# Patient Record
Sex: Female | Born: 1970 | Race: White | Hispanic: No | Smoking: Former smoker
Health system: Southern US, Community
[De-identification: ages and names within clinical notes are randomized; demographics above are authoritative.]

## PROBLEM LIST (undated history)

## (undated) DIAGNOSIS — R51 Headache: Secondary | ICD-10-CM

## (undated) DIAGNOSIS — M2540 Effusion, unspecified joint: Secondary | ICD-10-CM

## (undated) DIAGNOSIS — M8580 Other specified disorders of bone density and structure, unspecified site: Secondary | ICD-10-CM

## (undated) DIAGNOSIS — E119 Type 2 diabetes mellitus without complications: Secondary | ICD-10-CM

## (undated) DIAGNOSIS — E7800 Pure hypercholesterolemia, unspecified: Secondary | ICD-10-CM

## (undated) DIAGNOSIS — M2550 Pain in unspecified joint: Secondary | ICD-10-CM

## (undated) DIAGNOSIS — Z8709 Personal history of other diseases of the respiratory system: Secondary | ICD-10-CM

## (undated) DIAGNOSIS — M1990 Unspecified osteoarthritis, unspecified site: Secondary | ICD-10-CM

## (undated) DIAGNOSIS — T7840XA Allergy, unspecified, initial encounter: Secondary | ICD-10-CM

## (undated) HISTORY — DX: Pure hypercholesterolemia, unspecified: E78.00

## (undated) HISTORY — DX: Allergy, unspecified, initial encounter: T78.40XA

## (undated) HISTORY — PX: ABLATION: SHX5711

## (undated) HISTORY — PX: NO PAST SURGERIES: SHX2092

## (undated) HISTORY — DX: Type 2 diabetes mellitus without complications: E11.9

## (undated) HISTORY — DX: Other specified disorders of bone density and structure, unspecified site: M85.80

---

## 1998-07-29 ENCOUNTER — Other Ambulatory Visit: Admission: RE | Admit: 1998-07-29 | Discharge: 1998-07-29 | Payer: Self-pay | Admitting: Obstetrics and Gynecology

## 1998-09-03 ENCOUNTER — Inpatient Hospital Stay: Admission: AD | Admit: 1998-09-03 | Discharge: 1998-09-03 | Payer: Self-pay | Admitting: Obstetrics and Gynecology

## 1998-11-25 ENCOUNTER — Ambulatory Visit (HOSPITAL_COMMUNITY): Admission: RE | Admit: 1998-11-25 | Discharge: 1998-11-25 | Payer: Self-pay | Admitting: Obstetrics and Gynecology

## 1999-02-17 ENCOUNTER — Inpatient Hospital Stay (HOSPITAL_COMMUNITY): Admission: AD | Admit: 1999-02-17 | Discharge: 1999-02-20 | Payer: Self-pay | Admitting: Obstetrics and Gynecology

## 1999-02-25 ENCOUNTER — Encounter (HOSPITAL_COMMUNITY): Admission: RE | Admit: 1999-02-25 | Discharge: 1999-05-26 | Payer: Self-pay | Admitting: Obstetrics and Gynecology

## 1999-03-27 ENCOUNTER — Other Ambulatory Visit: Admission: RE | Admit: 1999-03-27 | Discharge: 1999-03-27 | Payer: Self-pay | Admitting: Obstetrics and Gynecology

## 2000-04-11 ENCOUNTER — Other Ambulatory Visit: Admission: RE | Admit: 2000-04-11 | Discharge: 2000-04-11 | Payer: Self-pay | Admitting: Obstetrics and Gynecology

## 2001-05-26 ENCOUNTER — Other Ambulatory Visit: Admission: RE | Admit: 2001-05-26 | Discharge: 2001-05-26 | Payer: Self-pay | Admitting: Obstetrics and Gynecology

## 2002-09-17 ENCOUNTER — Other Ambulatory Visit: Admission: RE | Admit: 2002-09-17 | Discharge: 2002-09-17 | Payer: Self-pay | Admitting: Obstetrics and Gynecology

## 2003-10-03 ENCOUNTER — Other Ambulatory Visit: Admission: RE | Admit: 2003-10-03 | Discharge: 2003-10-03 | Payer: Self-pay | Admitting: Obstetrics and Gynecology

## 2004-12-02 ENCOUNTER — Other Ambulatory Visit: Admission: RE | Admit: 2004-12-02 | Discharge: 2004-12-02 | Payer: Self-pay | Admitting: Obstetrics and Gynecology

## 2006-01-18 ENCOUNTER — Other Ambulatory Visit: Admission: RE | Admit: 2006-01-18 | Discharge: 2006-01-18 | Payer: Self-pay | Admitting: Obstetrics and Gynecology

## 2007-10-11 ENCOUNTER — Encounter: Admission: RE | Admit: 2007-10-11 | Discharge: 2007-10-11 | Payer: Self-pay | Admitting: Obstetrics and Gynecology

## 2009-12-11 ENCOUNTER — Encounter: Admission: RE | Admit: 2009-12-11 | Discharge: 2009-12-11 | Payer: Self-pay | Admitting: Obstetrics and Gynecology

## 2010-10-18 ENCOUNTER — Encounter: Payer: Self-pay | Admitting: Obstetrics and Gynecology

## 2012-01-05 ENCOUNTER — Other Ambulatory Visit: Payer: Self-pay | Admitting: Obstetrics and Gynecology

## 2012-01-05 DIAGNOSIS — N6009 Solitary cyst of unspecified breast: Secondary | ICD-10-CM

## 2012-01-07 ENCOUNTER — Ambulatory Visit
Admission: RE | Admit: 2012-01-07 | Discharge: 2012-01-07 | Disposition: A | Discharge status: Home / Self Care | Payer: 59 | Source: Ambulatory Visit | Attending: Obstetrics and Gynecology | Admitting: Obstetrics and Gynecology

## 2012-01-07 DIAGNOSIS — N6009 Solitary cyst of unspecified breast: Secondary | ICD-10-CM

## 2012-12-25 ENCOUNTER — Other Ambulatory Visit (HOSPITAL_COMMUNITY): Payer: 59

## 2013-01-09 ENCOUNTER — Encounter (HOSPITAL_COMMUNITY): Payer: Self-pay | Admitting: Pharmacy Technician

## 2013-01-13 NOTE — Pre-Procedure Instructions (Signed)
Theresa Payne  01/13/2013   Your procedure is scheduled on:  01-24-2013  Wednesday  Report to [facility] at 8:15 AM.Take East Elevators to 3rd floor  Call this number if you have problems the morning of surgery: [phone]   Remember:   Do not eat food or drink liquids after midnight.   Take these medicines the morning of surgery with A SIP OF WATER: none   Do not wear jewelry, make-up or nail polish.  Do not wear lotions, powders, or perfumes.   Do not shave 48 hours prior to surgery.   Do not bring valuables to the hospital.  Contacts, dentures or bridgework may not be worn into surgery.       Patients discharged the day of surgery will not be allowed to drive home.   Name and phone number of your driver: __________________________   Special Instructions: Shower using CHG 2 nights before surgery and the night before surgery.  If you shower the day of surgery use CHG.  Use special wash - you have one bottle of CHG for all showers.  You should use approximately 1/3 of the bottle for each shower.   Please read over the following fact sheets that you were given: Pain Booklet, Coughing and Deep Breathing and Surgical Site Infection Prevention

## 2013-01-15 ENCOUNTER — Encounter (HOSPITAL_COMMUNITY)
Admission: RE | Admit: 2013-01-15 | Discharge: 2013-01-15 | Disposition: A | Discharge status: Home / Self Care | Payer: 59 | Source: Ambulatory Visit | Attending: Orthopedic Surgery | Admitting: Orthopedic Surgery

## 2013-01-15 ENCOUNTER — Encounter (HOSPITAL_COMMUNITY): Payer: Self-pay

## 2013-01-15 DIAGNOSIS — Z01812 Encounter for preprocedural laboratory examination: Secondary | ICD-10-CM | POA: Insufficient documentation

## 2013-01-15 HISTORY — DX: Pain in unspecified joint: M25.50

## 2013-01-15 HISTORY — DX: Unspecified osteoarthritis, unspecified site: M19.90

## 2013-01-15 HISTORY — DX: Headache: R51

## 2013-01-15 HISTORY — DX: Effusion, unspecified joint: M25.40

## 2013-01-15 HISTORY — DX: Personal history of other diseases of the respiratory system: Z87.09

## 2013-01-15 LAB — CBC
MCH: 31.7 pg (ref 26.0–34.0)
MCV: 89.4 fL (ref 78.0–100.0)
Platelets: 161 10*3/uL (ref 150–400)

## 2013-01-15 LAB — SURGICAL PCR SCREEN: MRSA, PCR: NEGATIVE

## 2013-01-15 NOTE — Progress Notes (Signed)
Spoke with Cordelia Pen at Dr.Murphy's office and requested orders

## 2013-01-15 NOTE — Progress Notes (Signed)
Pt doesn't have a cardiologist  Denies stress test/heart cath/echo  Pt doesn't have a medical md-states will go to a free clinic if she gets sick  Denies ekg or cxr in past yr

## 2013-01-21 NOTE — H&P (Signed)
  [facility] [address]   [address] [place], [place] [address] [phone] A Division of [facility]  Loreta Ave, M.D.   Robert A. Thurston Hole, M.D.   Burnell Blanks, M.D.   Eulas Post, M.D.   Lunette Stands, M.D Buford Dresser, M.D.  Charlsie Quest, M.D.   Estell Harpin, M.D.   Melina Fiddler, M.D. Genene Churn. Barry Dienes, PA-C            Kirstin A. Shepperson, PA-C Josh San Isidro, PA-C [place], [place]   RE: Theresa Payne, Theresa Payne                                [id number]      DOB: 1971/03/22 PROGRESS NOTE: 01-09-13 Chief complaint: Left hip pain.  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  Returns.  States that symptoms are unchanged from previous visit.  She is wanting to proceed with left hip arthroscopy with debridement as scheduled.  She has had temporary improvement with previous intraarticular Depo-Medrol/Marcaine injections.   Current medications: Calcium and multivitamin. Allergies: Darvocet, Codeine and shellfish. Past medical/surgical history: Healthy. Review of systems: Patient currently denies lightheadedness, dizziness, fevers or chills, cardiac, pulmonary, GI, GU or neuro issues. Family history: Positive for heart disease, hypertension and diabetes. Social history: Quit smoking in 1997, admits occasional alcohol use.  Patient is divorced and has a teenage son.      EXAMINATION: Height: 5?5.  Weight: 125 pounds.  Temperature: 98.3.  Respirations: 16.  Blood pressure: 110/68.  Pulse: 63.  Pleasant white female, alert and oriented x 3 and in no acute distress.  No increase in respiratory effort.  Head is normocephalic, a traumatic.  PERRLA, EOMI.  Lungs: CTA bilaterally.  No wheezes.  Heart: RRR. No murmurs noted.  Abdomen: Flat and non-distended.  NBS x 4.  Soft and non-tender.  Left hip: Good range of motion,  but does have discomfort.  Gait is somewhat antalgic.  Neurovascularly intact.  Skin warm and dry.    X-RAYS: Previous MRI scan of the left hip shows mild degenerative fraying changes involving the superior labrum, along with a partial labral separation/detachment.    IMPRESSION: Left hip pain and mechanical symptoms secondary to labral tear.   PLAN: We will proceed with left hip arthroscopy with debridement as scheduled.  Surgical procedure, along with potential rehab/recovery time discussed.  All questions answered.    Loreta Ave, M.D.   Electronically verified by Loreta Ave, M.D. DFM(JMO):jjh D 01-10-13 T 01-11-13

## 2013-01-23 MED ORDER — CEFAZOLIN SODIUM-DEXTROSE 2-3 GM-% IV SOLR
2.0000 g | INTRAVENOUS | Status: AC
  Administered 2013-01-24: 2 g via INTRAVENOUS

## 2013-01-24 ENCOUNTER — Encounter (HOSPITAL_COMMUNITY)
Admission: RE | Disposition: A | Discharge status: Home / Self Care | Payer: Self-pay | Source: Ambulatory Visit | Attending: Orthopedic Surgery

## 2013-01-24 ENCOUNTER — Ambulatory Visit (HOSPITAL_COMMUNITY): Payer: 59 | Admitting: Anesthesiology

## 2013-01-24 ENCOUNTER — Encounter (HOSPITAL_COMMUNITY): Payer: Self-pay | Admitting: Anesthesiology

## 2013-01-24 ENCOUNTER — Ambulatory Visit (HOSPITAL_COMMUNITY)
Admission: RE | Admit: 2013-01-24 | Discharge: 2013-01-24 | Disposition: A | Discharge status: Home / Self Care | Payer: 59 | Source: Ambulatory Visit | Attending: Orthopedic Surgery | Admitting: Orthopedic Surgery

## 2013-01-24 ENCOUNTER — Ambulatory Visit (HOSPITAL_COMMUNITY): Payer: 59

## 2013-01-24 DIAGNOSIS — M169 Osteoarthritis of hip, unspecified: Secondary | ICD-10-CM | POA: Insufficient documentation

## 2013-01-24 DIAGNOSIS — M1610 Unilateral primary osteoarthritis, unspecified hip: Secondary | ICD-10-CM | POA: Insufficient documentation

## 2013-01-24 DIAGNOSIS — Z9889 Other specified postprocedural states: Secondary | ICD-10-CM

## 2013-01-24 DIAGNOSIS — M9420 Chondromalacia, unspecified site: Secondary | ICD-10-CM | POA: Insufficient documentation

## 2013-01-24 DIAGNOSIS — M2419 Other articular cartilage disorders, other specified site: Secondary | ICD-10-CM | POA: Insufficient documentation

## 2013-01-24 DIAGNOSIS — M249 Joint derangement, unspecified: Secondary | ICD-10-CM | POA: Insufficient documentation

## 2013-01-24 HISTORY — PX: HIP ARTHROSCOPY: SHX668

## 2013-01-24 LAB — URINALYSIS, ROUTINE W REFLEX MICROSCOPIC
Bilirubin Urine: NEGATIVE
Hgb urine dipstick: NEGATIVE
Ketones, ur: NEGATIVE mg/dL
Nitrite: NEGATIVE
pH: 7 (ref 5.0–8.0)

## 2013-01-24 LAB — COMPREHENSIVE METABOLIC PANEL
AST: 20 U/L (ref 0–37)
Alkaline Phosphatase: 55 U/L (ref 39–117)
BUN: 15 mg/dL (ref 6–23)
CO2: 28 mEq/L (ref 19–32)
Chloride: 106 mEq/L (ref 96–112)
Creatinine, Ser: 0.8 mg/dL (ref 0.50–1.10)
GFR calc non Af Amer: 89 mL/min — ABNORMAL LOW (ref >90)
Total Bilirubin: 0.6 mg/dL (ref 0.3–1.2)

## 2013-01-24 LAB — APTT: aPTT: 30 seconds (ref 24–37)

## 2013-01-24 LAB — PROTIME-INR: INR: 0.94 (ref 0.00–1.49)

## 2013-01-24 SURGERY — ARTHROSCOPY HIP
Anesthesia: General | Site: Hip | Laterality: Left | Wound class: Clean

## 2013-01-24 MED ORDER — EPHEDRINE SULFATE 50 MG/ML IJ SOLN
INTRAMUSCULAR | Status: DC | PRN
  Administered 2013-01-24: 5 mg via INTRAVENOUS
  Administered 2013-01-24: 10 mg via INTRAVENOUS

## 2013-01-24 MED ORDER — ROCURONIUM BROMIDE 100 MG/10ML IV SOLN
INTRAVENOUS | Status: DC | PRN
  Administered 2013-01-24: 10 mg via INTRAVENOUS
  Administered 2013-01-24: 40 mg via INTRAVENOUS

## 2013-01-24 MED ORDER — PROPOFOL 10 MG/ML IV BOLUS
INTRAVENOUS | Status: DC | PRN
  Administered 2013-01-24: 200 mg via INTRAVENOUS

## 2013-01-24 MED ORDER — LACTATED RINGERS IV SOLN
INTRAVENOUS | Status: DC
  Administered 2013-01-24 (×2): via INTRAVENOUS

## 2013-01-24 MED ORDER — HYDROMORPHONE HCL PF 1 MG/ML IJ SOLN: 0.2500 mg | INTRAMUSCULAR | Status: DC | PRN

## 2013-01-24 MED ORDER — LIDOCAINE HCL (CARDIAC) 20 MG/ML IV SOLN
INTRAVENOUS | Status: DC | PRN
  Administered 2013-01-24: 100 mg via INTRAVENOUS

## 2013-01-24 MED ORDER — SODIUM CHLORIDE 0.9 % IR SOLN
Status: DC | PRN
  Administered 2013-01-24: 6000 mL

## 2013-01-24 MED ORDER — METOCLOPRAMIDE HCL 5 MG/ML IJ SOLN
INTRAMUSCULAR | Status: DC | PRN
  Administered 2013-01-24: 10 mg via INTRAVENOUS

## 2013-01-24 MED ORDER — CEFAZOLIN SODIUM-DEXTROSE 2-3 GM-% IV SOLR
INTRAVENOUS | Status: AC
  Filled 2013-01-24: qty 50

## 2013-01-24 MED ORDER — METOCLOPRAMIDE HCL 5 MG/ML IJ SOLN: 10.0000 mg | Freq: Once | INTRAMUSCULAR | Status: DC | PRN

## 2013-01-24 MED ORDER — OXYCODONE HCL 5 MG/5ML PO SOLN: 5.0000 mg | Freq: Once | ORAL | Status: AC | PRN

## 2013-01-24 MED ORDER — BUPIVACAINE HCL (PF) 0.25 % IJ SOLN
INTRAMUSCULAR | Status: AC
  Filled 2013-01-24: qty 30

## 2013-01-24 MED ORDER — FENTANYL CITRATE 0.05 MG/ML IJ SOLN
INTRAMUSCULAR | Status: DC | PRN
  Administered 2013-01-24: 100 ug via INTRAVENOUS

## 2013-01-24 MED ORDER — ONDANSETRON HCL 4 MG/2ML IJ SOLN
INTRAMUSCULAR | Status: DC | PRN
  Administered 2013-01-24: 4 mg via INTRAVENOUS

## 2013-01-24 MED ORDER — DEXAMETHASONE SODIUM PHOSPHATE 10 MG/ML IJ SOLN
INTRAMUSCULAR | Status: DC | PRN
  Administered 2013-01-24: 8 mg via INTRAVENOUS

## 2013-01-24 MED ORDER — OXYCODONE-ACETAMINOPHEN 5-325 MG PO TABS: 1.0000 | ORAL_TABLET | ORAL | Status: DC | PRN

## 2013-01-24 MED ORDER — NEOSTIGMINE METHYLSULFATE 1 MG/ML IJ SOLN
INTRAMUSCULAR | Status: DC | PRN
  Administered 2013-01-24: 4 mg via INTRAVENOUS

## 2013-01-24 MED ORDER — OXYCODONE HCL 5 MG PO TABS
ORAL_TABLET | ORAL | Status: AC
  Administered 2013-01-24: 5 mg via ORAL
  Filled 2013-01-24: qty 1

## 2013-01-24 MED ORDER — METHYLPREDNISOLONE ACETATE 80 MG/ML IJ SUSP
INTRAMUSCULAR | Status: AC
  Filled 2013-01-24: qty 1

## 2013-01-24 MED ORDER — MIDAZOLAM HCL 5 MG/5ML IJ SOLN
INTRAMUSCULAR | Status: DC | PRN
  Administered 2013-01-24: 2 mg via INTRAVENOUS

## 2013-01-24 MED ORDER — GLYCOPYRROLATE 0.2 MG/ML IJ SOLN
INTRAMUSCULAR | Status: DC | PRN
  Administered 2013-01-24: 0.6 mg via INTRAVENOUS

## 2013-01-24 MED ORDER — BUPIVACAINE HCL (PF) 0.25 % IJ SOLN
INTRAMUSCULAR | Status: DC | PRN
  Administered 2013-01-24: 20 mL
  Administered 2013-01-24: 10 mL via INTRA_ARTICULAR

## 2013-01-24 MED ORDER — OXYCODONE HCL 5 MG PO TABS: 5.0000 mg | ORAL_TABLET | Freq: Once | ORAL | Status: AC | PRN

## 2013-01-24 MED ORDER — METHYLPREDNISOLONE ACETATE 80 MG/ML IJ SUSP
INTRAMUSCULAR | Status: DC | PRN
  Administered 2013-01-24: 80 mg via INTRA_ARTICULAR

## 2013-01-24 SURGICAL SUPPLY — 36 items
BLADE DA 4.2 (BLADE) IMPLANT
BLADE GREAT WHITE 4.2 (BLADE) IMPLANT
BLADE SURG 11 STRL SS (BLADE) ×2 IMPLANT
BOOTCOVER CLEANROOM LRG (PROTECTIVE WEAR) ×4 IMPLANT
CLOTH BEACON ORANGE TIMEOUT ST (SAFETY) ×2 IMPLANT
DRAPE STERI IOBAN 125X83 (DRAPES) ×2 IMPLANT
DRSG EMULSION OIL 3X3 NADH (GAUZE/BANDAGES/DRESSINGS) ×2 IMPLANT
DRSG MEPILEX BORDER 4X4 (GAUZE/BANDAGES/DRESSINGS) ×2 IMPLANT
DRSG PAD ABDOMINAL 8X10 ST (GAUZE/BANDAGES/DRESSINGS) ×2 IMPLANT
DURAPREP 26ML APPLICATOR (WOUND CARE) ×2 IMPLANT
ELECT MENISCUS 165MM 90D (ELECTRODE) ×2 IMPLANT
GAUZE XEROFORM 1X8 LF (GAUZE/BANDAGES/DRESSINGS) ×2 IMPLANT
GLOVE BIOGEL PI IND STRL 8 (GLOVE) ×1 IMPLANT
GLOVE BIOGEL PI INDICATOR 8 (GLOVE) ×1
GLOVE ORTHO TXT STRL SZ7.5 (GLOVE) ×4 IMPLANT
GOWN STRL NON-REIN LRG LVL3 (GOWN DISPOSABLE) ×6 IMPLANT
KIT HIP ARTHROSCOPY (SET/KITS/TRAYS/PACK) ×2 IMPLANT
KIT ROOM TURNOVER OR (KITS) ×2 IMPLANT
MANIFOLD NEPTUNE II (INSTRUMENTS) ×2 IMPLANT
MARKER SKIN DUAL TIP RULER LAB (MISCELLANEOUS) ×2 IMPLANT
NEEDLE HYPO 18GX1.5 BLUNT FILL (NEEDLE) ×2 IMPLANT
PACK SURGICAL SETUP 50X90 (CUSTOM PROCEDURE TRAY) ×2 IMPLANT
PAD ARMBOARD 7.5X6 YLW CONV (MISCELLANEOUS) ×4 IMPLANT
SET ARTHROSCOPY TUBING (MISCELLANEOUS) ×1
SET ARTHROSCOPY TUBING LN (MISCELLANEOUS) ×1 IMPLANT
SHAVER EXTENDED LENGTH 4.2 (BLADE) ×2 IMPLANT
SPONGE GAUZE 4X4 12PLY (GAUZE/BANDAGES/DRESSINGS) ×2 IMPLANT
SPONGE LAP 18X18 X RAY DECT (DISPOSABLE) ×2 IMPLANT
SUT ETHILON 4 0 PS 2 18 (SUTURE) ×2 IMPLANT
TAPE CLOTH 4X10 WHT NS (GAUZE/BANDAGES/DRESSINGS) ×2 IMPLANT
TOWEL OR 17X24 6PK STRL BLUE (TOWEL DISPOSABLE) ×2 IMPLANT
TOWEL OR 17X26 10 PK STRL BLUE (TOWEL DISPOSABLE) ×2 IMPLANT
TUBE CONNECTING 12X1/4 (SUCTIONS) ×2 IMPLANT
WAND 50 DEG COVAC W/CORD (SURGICAL WAND) IMPLANT
WAND 90 DEG TURBOVAC W/CORD (SURGICAL WAND) IMPLANT
WATER STERILE IRR 1000ML POUR (IV SOLUTION) ×2 IMPLANT

## 2013-01-24 NOTE — Preoperative (Signed)
Beta Blockers   Reason not to administer Beta Blockers:Not Applicable 

## 2013-01-24 NOTE — Anesthesia Procedure Notes (Signed)
Procedure Name: Intubation Date/Time: 01/24/2013 11:58 AM Performed by: Arlice Colt B Pre-anesthesia Checklist: Patient identified, Emergency Drugs available, Suction available, Patient being monitored and Timeout performed Patient Re-evaluated:Patient Re-evaluated prior to inductionOxygen Delivery Method: Circle system utilized Preoxygenation: Pre-oxygenation with 100% oxygen Intubation Type: IV induction Ventilation: Mask ventilation without difficulty Laryngoscope Size: Mac and 3 Grade View: Grade II Tube type: Oral Tube size: 7.5 mm Number of attempts: 1 Airway Equipment and Method: Stylet Placement Confirmation: ETT inserted through vocal cords under direct vision,  positive ETCO2 and breath sounds checked- equal and bilateral Secured at: 21 cm Tube secured with: Tape Dental Injury: Teeth and Oropharynx as per pre-operative assessment

## 2013-01-24 NOTE — Interval H&P Note (Signed)
History and Physical Interval Note:  01/24/2013 8:28 AM  Theresa Payne  has presented today for surgery, with the diagnosis of left hip labral tear  The various methods of treatment have been discussed with the patient and family. After consideration of risks, benefits and other options for treatment, the patient has consented to  Procedure(s): ARTHROSCOPY HIP (Left) as a surgical intervention .  The patient's history has been reviewed, patient examined, no change in status, stable for surgery.  I have reviewed the patient's chart and labs.  Questions were answered to the patient's satisfaction.     [name]

## 2013-01-24 NOTE — Brief Op Note (Signed)
01/24/2013  1:15 PM  PATIENT:  Theresa Payne  42 y.o. female  PRE-OPERATIVE DIAGNOSIS:  left hip labral tear  POST-OPERATIVE DIAGNOSIS:  left hip labral tear  PROCEDURE:  Procedure(s): ARTHROSCOPY HIP with labral tear debridment and chondroplasty (Left)  SURGEON:  Surgeon(s) and Role:    * Loreta Ave, MD - Primary  PHYSICIAN ASSISTANT: Keatin Benham M    ANESTHESIA:   general  EBL:  Total I/O In: 1000 [I.V.:1000] Out: -    SPECIMEN:  No Specimen    COUNTS:  YES  TOURNIQUET:  * No tourniquets in log *   PATIENT DISPOSITION:  PACU - hemodynamically stable.

## 2013-01-24 NOTE — Anesthesia Preprocedure Evaluation (Signed)
Anesthesia Evaluation  Patient identified by MRN, date of birth, ID band Patient awake    Reviewed: Allergy & Precautions, H&P , NPO status , Patient's Chart, lab work & pertinent test results, reviewed documented beta blocker date and time   Airway Mallampati: II TM Distance: >3 FB Neck ROM: full    Dental   Pulmonary neg pulmonary ROS,  breath sounds clear to auscultation        Cardiovascular negative cardio ROS  Rhythm:regular     Neuro/Psych  Headaches, negative psych ROS   GI/Hepatic negative GI ROS, Neg liver ROS,   Endo/Other  negative endocrine ROS  Renal/GU negative Renal ROS  negative genitourinary   Musculoskeletal   Abdominal   Peds  Hematology negative hematology ROS (+)   Anesthesia Other Findings See surgeon's H&P   Reproductive/Obstetrics negative OB ROS                           Anesthesia Physical Anesthesia Plan  ASA: II  Anesthesia Plan: General   Post-op Pain Management:    Induction: Intravenous  Airway Management Planned: Oral ETT  Additional Equipment:   Intra-op Plan:   Post-operative Plan: Extubation in OR  Informed Consent: I have reviewed the patients History and Physical, chart, labs and discussed the procedure including the risks, benefits and alternatives for the proposed anesthesia with the patient or authorized representative who has indicated his/her understanding and acceptance.   Dental Advisory Given  Plan Discussed with: CRNA and Surgeon  Anesthesia Plan Comments:         Anesthesia Quick Evaluation

## 2013-01-24 NOTE — Transfer of Care (Signed)
Immediate Anesthesia Transfer of Care Note  Patient: Theresa Payne  Procedure(s) Performed: Procedure(s): ARTHROSCOPY HIP with labral tear debridment and chondroplasty (Left)  Patient Location: PACU  Anesthesia Type:General  Level of Consciousness: awake, alert  and oriented  Airway & Oxygen Therapy: Patient Spontanous Breathing and Patient connected to nasal cannula oxygen  Post-op Assessment: Report given to PACU RN, Post -op Vital signs reviewed and stable and Patient moving all extremities X 4  Post vital signs: Reviewed and stable  Complications: No apparent anesthesia complications

## 2013-01-24 NOTE — Progress Notes (Signed)
Orthopedic Tech Progress Note Patient Details:  Theresa Payne 09-06-1971 [id number] Issued crutches, taught proper use. Ortho Devices Type of Ortho Device: Crutches Ortho Device/Splint Interventions: Application;Adjustment   Lesle Chris 01/24/2013, 3:02 PM

## 2013-01-25 NOTE — Anesthesia Postprocedure Evaluation (Signed)
Anesthesia Post Note  Patient: Theresa Payne  Procedure(s) Performed: Procedure(s) (LRB): ARTHROSCOPY HIP with labral tear debridment and chondroplasty (Left)  Anesthesia type: General  Patient location: PACU  Post pain: Pain level controlled  Post assessment: Patient's Cardiovascular Status Stable  Last Vitals:  Filed Vitals:   01/24/13 1440  BP: 97/62  Pulse:   Temp: 35.8 C  Resp: 14    Post vital signs: Reviewed and stable  Level of consciousness: alert  Complications: No apparent anesthesia complications

## 2013-01-25 NOTE — Op Note (Signed)
NAME:  Theresa Payne, Theresa Payne              ACCOUNT NO.:  [id number]  MEDICAL RECORD NO.:  [id number]  LOCATION:  [facility]                         FACILITY:  [facility]  PHYSICIAN:  Loreta Ave, M.D. DATE OF BIRTH:  March 21, 1971  DATE OF PROCEDURE:  01/24/2013 DATE OF DISCHARGE:  01/24/2013                              OPERATIVE REPORT   PREOPERATIVE DIAGNOSIS:  Left hip anterior superior labral tear.  POSTOPERATIVE DIAGNOSIS:  Left hip anterior superior labral tear with some focal chondromalacia below that.  PROCEDURE:  Left hip exam under anesthesia, arthroscopy, with fluoroscopic guidance.  Debridement of labrum and articular cartilage.  SURGEON:  Loreta Ave, MD  ASSISTANT:  Genene Churn. Barry Dienes, [place], present throughout the entire case and necessary for timely completion of procedure.  ANESTHESIA:  General.  ESTIMATED BLOOD LOSS:  Minimal.  SPECIMENS:  None.  CULTURES:  None.  COMPLICATIONS:  None.  DRESSINGS:  Soft compressive.  DESCRIPTION OF PROCEDURE:  The patient was brought to the operating room, placed on the operating table in supine position.  After adequate anesthesia had been obtained, placed on the fracture table in lateral position with appropriate padding and support.  Prepped and draped in usual sterile fashion.  Longitudinal traction fluoroscopic guidance. The hip was opened a cm to allow access.  Two portals, one anterolateral, the other posterolateral.  Utilizing first spinal needle, then guidewire, then dilator, the hip was accessed.  The arthroscope was introduced along with a shaver.  Complex tearing at labrum all along the top, front more than back.  Debrided back to a stable surface.  A little focal grade 3 and 4 chondromalacia with a rim underneath the tear debrided.  Entire joint inspected.  No other significant findings were appreciated.  Remaining articular cartilage looked good.  The hip was injected with Depo-Medrol and Marcaine.  Traction removed.   Fluoroscopy used to confirm adequate reduction.  Portals were closed with nylon after we injected with Marcaine.  Sterile compressive dressing applied.  Returned to supine position.  Anesthesia reversed.  Brought to the recovery room. Tolerated the surgery well with no complications.     Loreta Ave, M.D.     DFM/MEDQ  D:  01/24/2013  T:  01/25/2013  Job:  [id number]

## 2013-01-29 ENCOUNTER — Encounter (HOSPITAL_COMMUNITY): Payer: Self-pay | Admitting: Orthopedic Surgery

## 2018-09-05 ENCOUNTER — Other Ambulatory Visit: Payer: Self-pay | Admitting: Obstetrics and Gynecology

## 2018-09-05 DIAGNOSIS — Z1231 Encounter for screening mammogram for malignant neoplasm of breast: Secondary | ICD-10-CM

## 2018-09-13 ENCOUNTER — Ambulatory Visit
Admission: RE | Admit: 2018-09-13 | Discharge: 2018-09-13 | Disposition: A | Discharge status: Home / Self Care | Payer: BLUE CROSS/BLUE SHIELD | Source: Ambulatory Visit | Attending: Obstetrics and Gynecology | Admitting: Obstetrics and Gynecology

## 2018-09-13 DIAGNOSIS — Z1231 Encounter for screening mammogram for malignant neoplasm of breast: Secondary | ICD-10-CM

## 2018-09-14 ENCOUNTER — Other Ambulatory Visit: Payer: Self-pay | Admitting: Obstetrics and Gynecology

## 2018-09-14 DIAGNOSIS — R928 Other abnormal and inconclusive findings on diagnostic imaging of breast: Secondary | ICD-10-CM

## 2018-09-18 ENCOUNTER — Ambulatory Visit
Admission: RE | Admit: 2018-09-18 | Discharge: 2018-09-18 | Disposition: A | Discharge status: Home / Self Care | Payer: BLUE CROSS/BLUE SHIELD | Source: Ambulatory Visit | Attending: Obstetrics and Gynecology | Admitting: Obstetrics and Gynecology

## 2018-09-18 ENCOUNTER — Ambulatory Visit: Payer: BLUE CROSS/BLUE SHIELD

## 2018-09-18 DIAGNOSIS — R928 Other abnormal and inconclusive findings on diagnostic imaging of breast: Secondary | ICD-10-CM

## 2020-03-25 IMAGING — MG DIGITAL SCREENING BILATERAL MAMMOGRAM WITH TOMO AND CAD
8 series · 9 of 24 positions shown · non-contrast
Comparison: 08/22/2017 and earlier

CLINICAL DATA: Screening.

EXAM:
DIGITAL SCREENING BILATERAL MAMMOGRAM WITH TOMO AND CAD

[L MLO synth-2D]
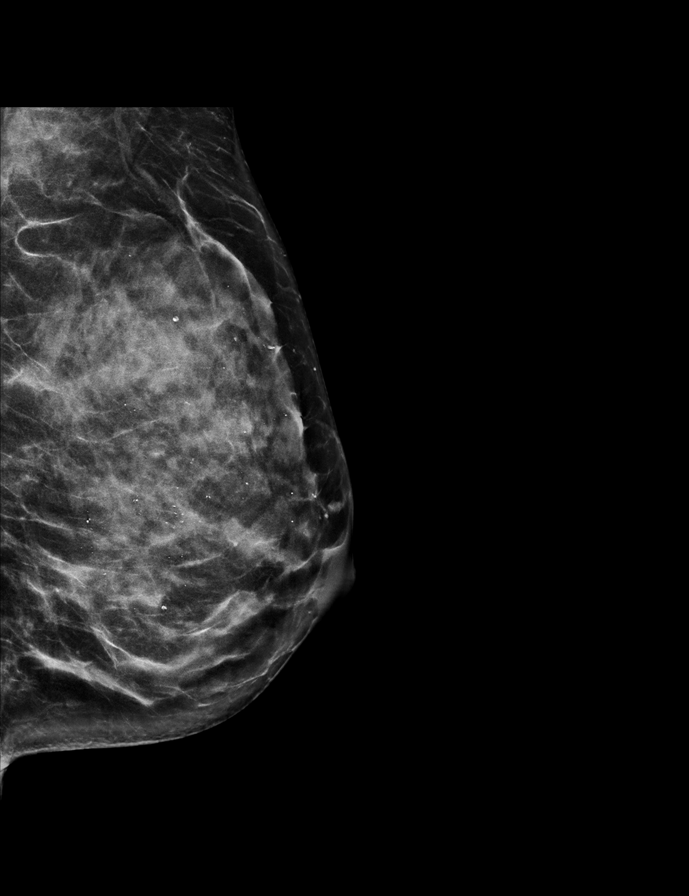

[R CC synth-2D]
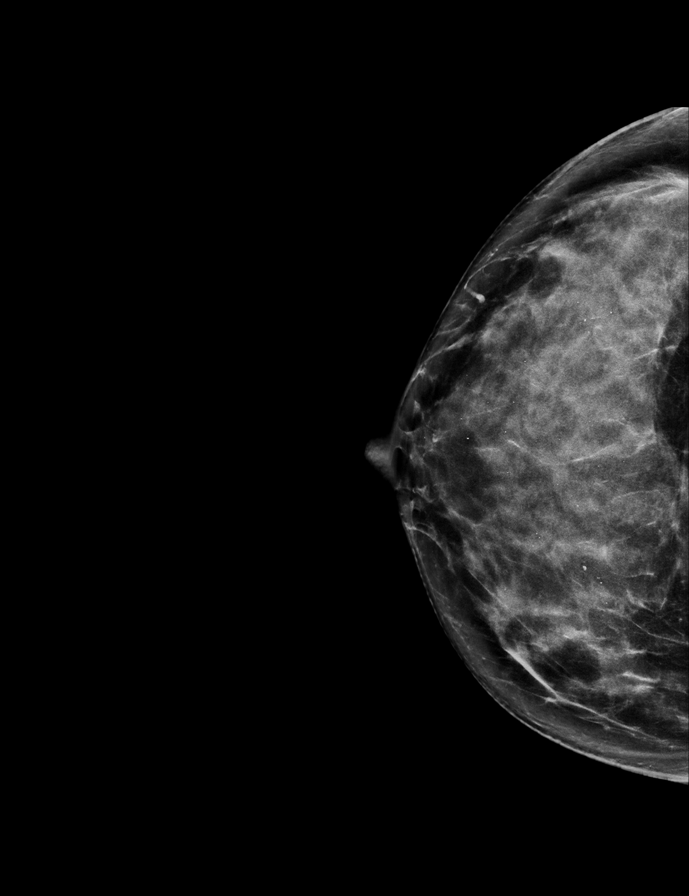

[L CC synth-2D]
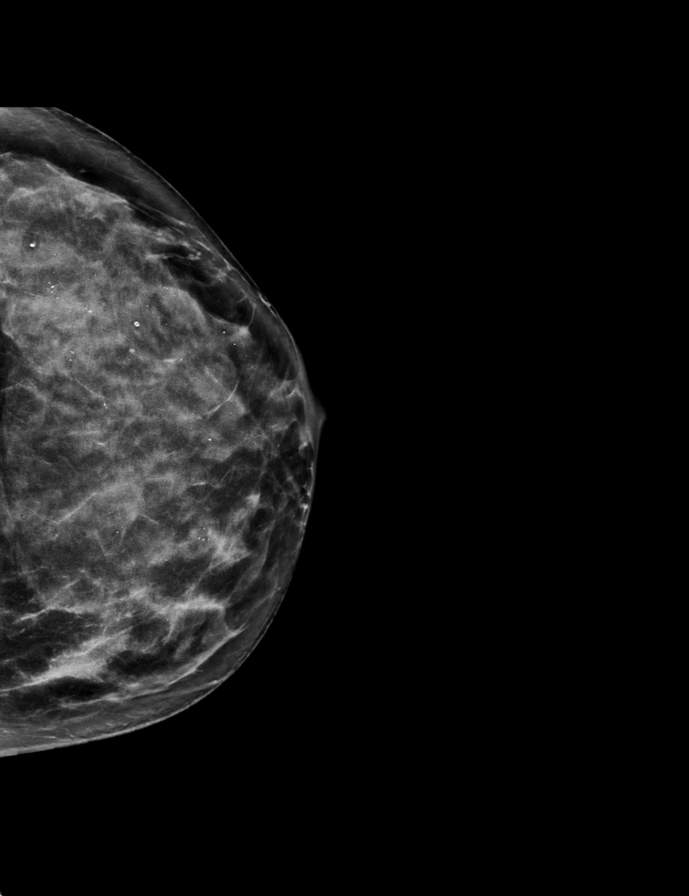

[R MLO synth-2D]
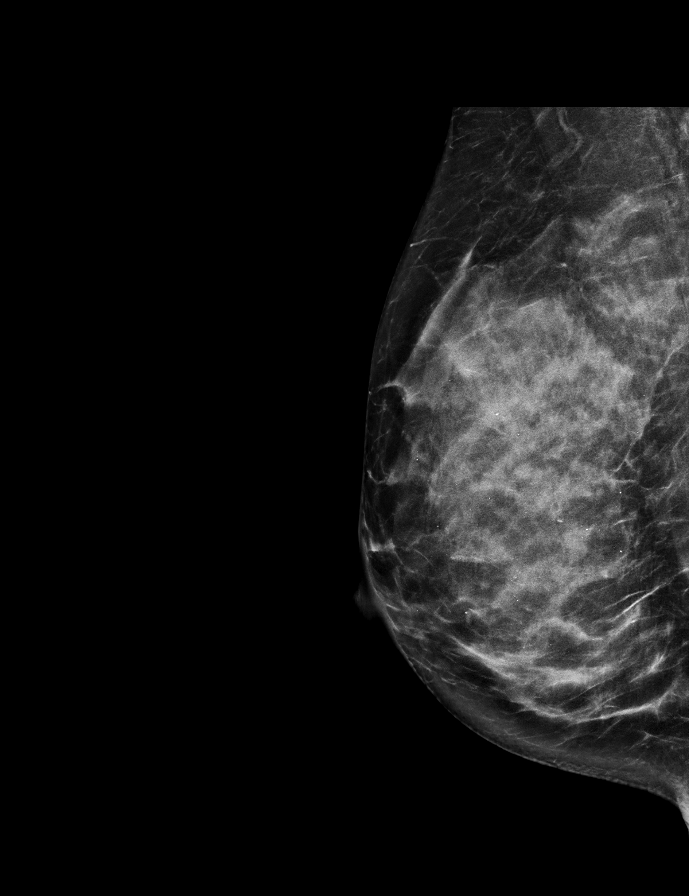

[R MLO tomo · 2 of 64 frames shown]
[frame 21/64]
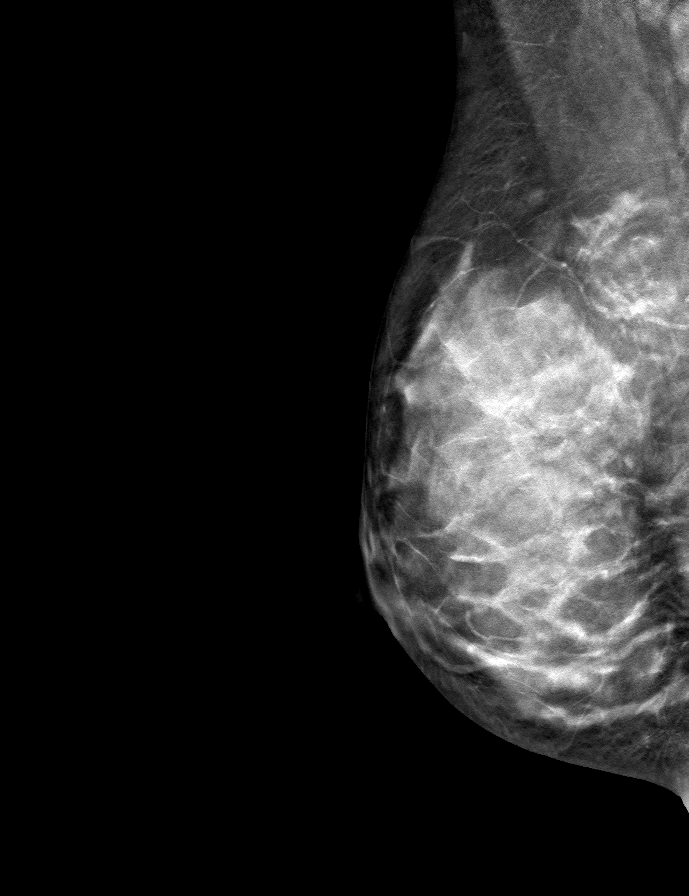
[frame 33/64]
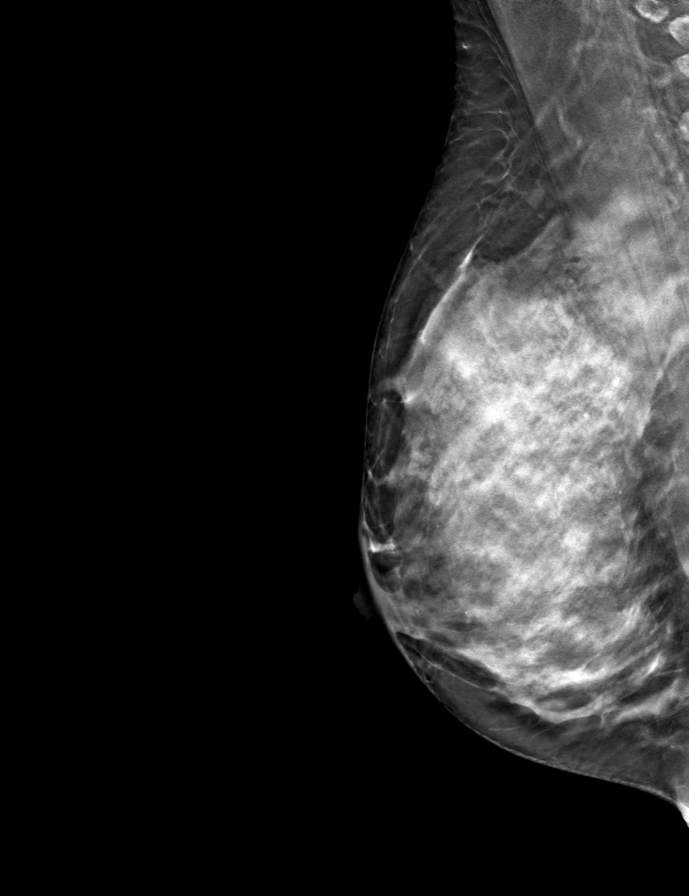

[L MLO tomo · tomo slice 31/62.0]
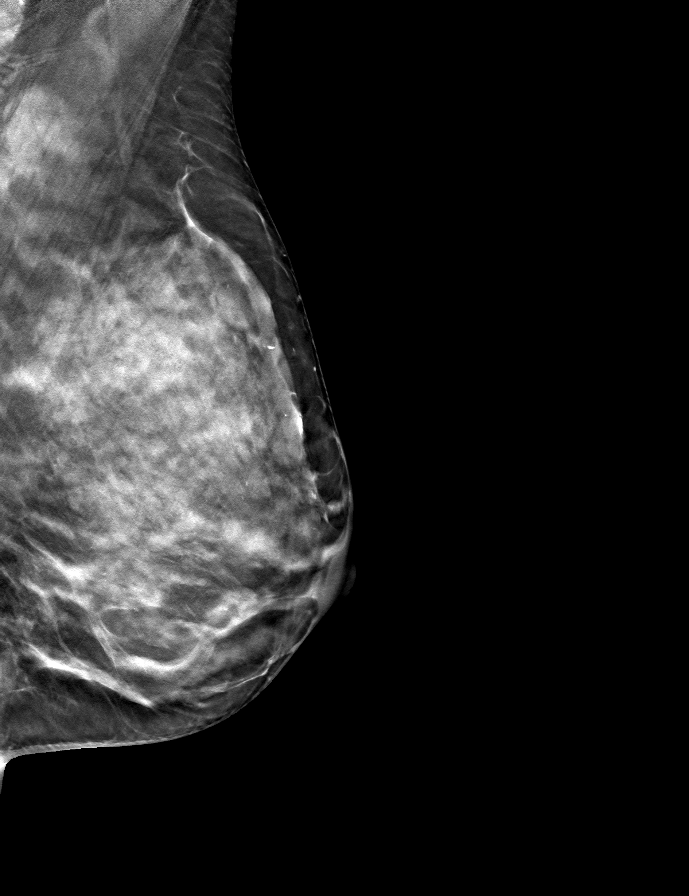

[L CC tomo · tomo slice 30/59.0]
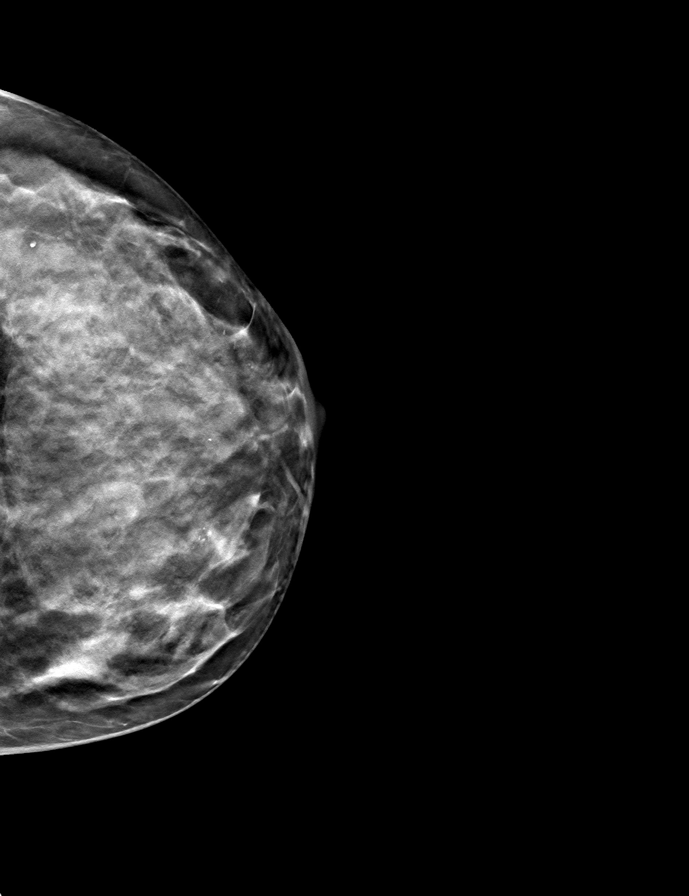

[R CC tomo · tomo slice 33/64.0]
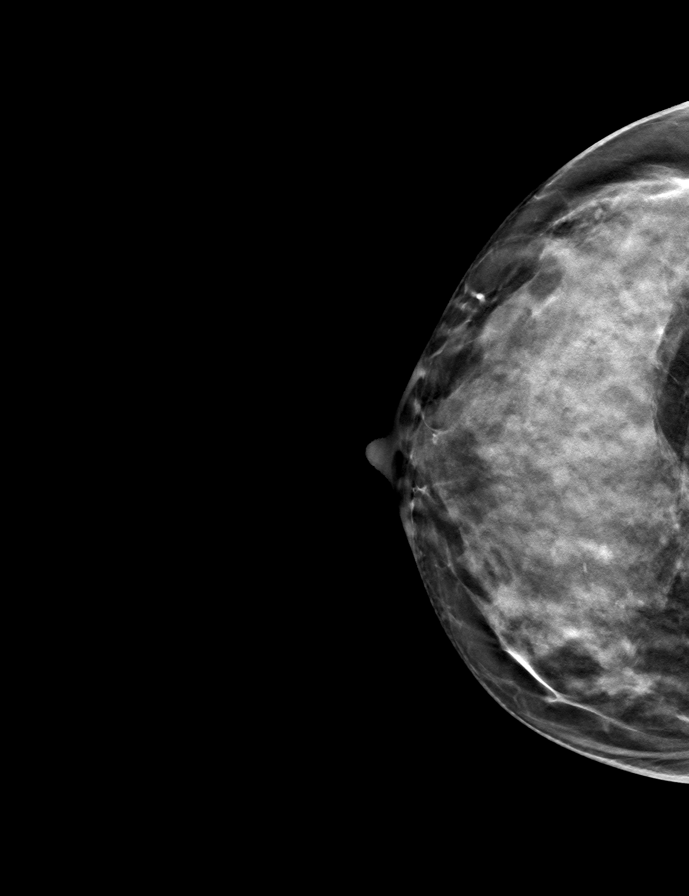

[9 of 24 positions shown; findings below may reference images not displayed]

ACR Breast Density Category d: The breast tissue is extremely dense,
which lowers the sensitivity of mammography.
FINDINGS: In the left breast, possible distortion warrants further evaluation.
In the right breast, no findings suspicious for malignancy. Images
were processed with CAD.
IMPRESSION: Further evaluation is suggested for possible distortion in the left
breast.

RECOMMENDATION:
Diagnostic mammogram and possibly ultrasound of the left breast.
(Code:[id number]-[id number])

The patient will be contacted regarding the findings, and additional
imaging will be scheduled.

BI-RADS CATEGORY  0: Incomplete. Need additional imaging evaluation
and/or prior mammograms for comparison.

## 2022-09-01 ENCOUNTER — Encounter: Payer: Self-pay | Admitting: Internal Medicine

## 2022-09-09 ENCOUNTER — Encounter: Payer: Self-pay | Admitting: Cardiology

## 2022-09-09 ENCOUNTER — Ambulatory Visit: Payer: 59 | Attending: Cardiology | Admitting: Cardiology

## 2022-09-09 VITALS — BP 120/80 | HR 64 | Ht 65.0 in | Wt 139.0 lb

## 2022-09-09 DIAGNOSIS — E7800 Pure hypercholesterolemia, unspecified: Secondary | ICD-10-CM

## 2022-09-09 NOTE — Progress Notes (Signed)
Cardiology Office Note:    Date:  09/09/2022   ID:  Theresa Payne, DOB April 14, 1971, MRN [id number]  PCP:  Aviva Kluver   [facility] Providers Cardiologist:  Donato Schultz, MD     Referring MD: Candice Camp, MD    History of Present Illness:    Theresa Payne is a 51 y.o. female here for the evaluation of hyperlipidemia at the request of Dr. Candice Camp from [facility].  Family history of hyperlipidemia Brother father and mother.  No early family history of coronary artery disease.  Teacher.  Remote smoker quit several years ago after 5 to 10 years.  Ascribes to healthy lifestyle exercise.  Total cholesterol 288 triglycerides 77 HDL 81 LDL 195.  No fevers chills nausea vomiting syncope bleeding.  Ascribes to a low carbohydrate diet.  No chest pain no shortness of breath.  Hemoglobin A1c 4.8, creatinine 0.7 sodium 142 potassium 5.1 albumin 4.6 AST 22 ALT 14 TSH 2.8 Free T41.0 Hemoglobin 15.9 hematocrit 46 platelets 184 all of this was performed on 08/05/2022.  Past Medical History:  Diagnosis Date   Arthritis    hip   DM (diabetes mellitus) (HCC)    Headache(784.0)    History of bronchitis    many of yrs ago   Hypercholesterolemia    Joint pain    Joint swelling     Past Surgical History:  Procedure Laterality Date   HIP ARTHROSCOPY Left 01/24/2013   Procedure: ARTHROSCOPY HIP with labral tear debridment and chondroplasty;  Surgeon: Loreta Ave, MD;  Location: [facility];  Service: Orthopedics;  Laterality: Left;   NO PAST SURGERIES      Current Medications: No outpatient medications have been marked as taking for the 09/09/22 encounter (Office Visit) with Jake Bathe, MD.     Allergies:   Shellfish allergy and Codeine   Social History   Socioeconomic History   Marital status: Divorced    Spouse name: Not on file   Number of children: Not on file   Years of education: Not on file   Highest education level: Not on file  Occupational  History   Not on file  Tobacco Use   Smoking status: Former   Smokeless tobacco: Not on file   Tobacco comments:    quit smoking 55yrs ago  Substance and Sexual Activity   Alcohol use: Yes    Comment: occasionally   Drug use: No   Sexual activity: Never  Other Topics Concern   Not on file  Social History Narrative   Not on file   Social Determinants of Health   Financial Resource Strain: Not on file  Food Insecurity: Not on file  Transportation Needs: Not on file  Physical Activity: Not on file  Stress: Not on file  Social Connections: Not on file     Family History: The patient's family history includes Dementia in her maternal grandmother, mother, and paternal grandfather; Diabetes in her maternal grandfather; Heart disease in her maternal grandfather; Hypercholesterolemia in her brother, father, and mother; Osteoporosis in her maternal grandmother and mother.  ROS:   Please see the history of present illness.     All other systems reviewed and are negative.  EKGs/Labs/Other Studies Reviewed:    The following studies were reviewed today: Lab work from Dr. Rana Snare reviewed.  EKG:  The ekg ordered today demonstrates sinus rhythm 64  Recent Labs: No results found for requested labs within last 365 days.  Recent  Lipid Panel No results found for: "CHOL", "TRIG", "HDL", "CHOLHDL", "VLDL", "LDLCALC", "LDLDIRECT"   Risk Assessment/Calculations:               Physical Exam:    VS:  BP 120/80 (BP Location: Left Arm, Patient Position: Sitting, Cuff Size: Normal)   Pulse 64   Ht 5\' 5"  (1.651 m)   Wt 139 lb (63 kg)   BMI 23.13 kg/m     Wt Readings from Last 3 Encounters:  09/09/22 139 lb (63 kg)  01/15/13 138 lb (62.6 kg)     GEN:  Well nourished, well developed in no acute distress HEENT: Normal NECK: No JVD; No carotid bruits LYMPHATICS: No lymphadenopathy CARDIAC: RRR, no murmurs, rubs, gallops RESPIRATORY:  Clear to auscultation without rales, wheezing  or rhonchi  ABDOMEN: Soft, non-tender, non-distended MUSCULOSKELETAL:  No edema; No deformity  SKIN: Warm and dry NEUROLOGIC:  Alert and oriented x 3 PSYCHIATRIC:  Normal affect   ASSESSMENT:    1. Hypercholesterolemia    PLAN:    In order of problems listed above:  Pure hypercholesterolemia - LDL 195 discussed guideline directed therapy, LDLs greater than 190 usually we would go ahead and start statin therapy.  Her mother in her 105s is currently on simvastatin and has been on for 20 years.  Continue with diet and exercise.  Low-carb diet.  Excellent. -Discussed risks of elevated LDL, long-term risks of coronary artery disease.  Possible familial hyperlipidemia. - We will check a coronary calcium score - We will check NMR profile as well as LP(a). - Thyroid function is normal from outside blood work.  Liver function normal.  Kidney function normal.  Excellent hemoglobin A1c.  Asymptomatic.  We will follow-up with results of blood work.          Medication Adjustments/Labs and Tests Ordered: Current medicines are reviewed at length with the patient today.  Concerns regarding medicines are outlined above.  Orders Placed This Encounter  Procedures   CT CARDIAC SCORING (SELF PAY ONLY)   NMR, lipoprofile   Lipoprotein A (LPA)   EKG 12-Lead   No orders of the defined types were placed in this encounter.   Patient Instructions  Medication Instructions:  The current medical regimen is effective;  continue present plan and medications.  *If you need a refill on your cardiac medications before your next appointment, please call your pharmacy*  Lab Work: Please have blood work today.  If you have labs (blood work) drawn today and your tests are completely normal, you will receive your results only by: MyChart Message (if you have MyChart) OR A paper copy in the mail If you have any lab test that is abnormal or we need to change your treatment, we will call you to review the  results.  Testing/Procedures: Your physician has requested that you have a Coronary Calcium score which is completed by CT. Cardiac computed tomography (CT) is a painless test that uses an x-ray machine to take clear, detailed pictures of your heart. There are no instructions for this testing.  You may eat/drink and take your normal medications this day.  The cost of the testing is $99 due at the time of your appointment. This will be completed at our [facility] location.  Follow-Up: At [facility], you and your health needs are our priority.  As part of our continuing mission to provide you with exceptional heart care, we have created designated Provider Care Teams.  These Care Teams include your  primary Cardiologist (physician) and Advanced Practice Providers (APPs -  Physician Assistants and Nurse Practitioners) who all work together to provide you with the care you need, when you need it.  We recommend signing up for the patient portal called "MyChart".  Sign up information is provided on this After Visit Summary.  MyChart is used to connect with patients for Virtual Visits (Telemedicine).  Patients are able to view lab/test results, encounter notes, upcoming appointments, etc.  Non-urgent messages can be sent to your provider as well.   To learn more about what you can do with MyChart, go to [email or web address].    Your next appointment:   Follow up will be based on the results of the above testing.  Important Information About Sugar         Signed, Donato Schultz, MD  09/09/2022 12:14 PM    [facility]

## 2022-09-09 NOTE — Patient Instructions (Signed)
Medication Instructions:  The current medical regimen is effective;  continue present plan and medications.  *If you need a refill on your cardiac medications before your next appointment, please call your pharmacy*  Lab Work: Please have blood work today.  If you have labs (blood work) drawn today and your tests are completely normal, you will receive your results only by: MyChart Message (if you have MyChart) OR A paper copy in the mail If you have any lab test that is abnormal or we need to change your treatment, we will call you to review the results.  Testing/Procedures: Your physician has requested that you have a Coronary Calcium score which is completed by CT. Cardiac computed tomography (CT) is a painless test that uses an x-ray machine to take clear, detailed pictures of your heart. There are no instructions for this testing.  You may eat/drink and take your normal medications this day.  The cost of the testing is $99 due at the time of your appointment. This will be completed at our [facility] location.  Follow-Up: At [facility], you and your health needs are our priority.  As part of our continuing mission to provide you with exceptional heart care, we have created designated Provider Care Teams.  These Care Teams include your primary Cardiologist (physician) and Advanced Practice Providers (APPs -  Physician Assistants and Nurse Practitioners) who all work together to provide you with the care you need, when you need it.  We recommend signing up for the patient portal called "MyChart".  Sign up information is provided on this After Visit Summary.  MyChart is used to connect with patients for Virtual Visits (Telemedicine).  Patients are able to view lab/test results, encounter notes, upcoming appointments, etc.  Non-urgent messages can be sent to your provider as well.   To learn more about what you can do with MyChart, go to [email or web address].    Your next  appointment:   Follow up will be based on the results of the above testing.  Important Information About Sugar

## 2022-09-10 LAB — NMR, LIPOPROFILE

## 2022-09-11 LAB — NMR, LIPOPROFILE
Cholesterol, Total: 307 mg/dL — ABNORMAL HIGH (ref 100–199)
HDL-C: 92 mg/dL (ref >39)
LDL Size: 21.5 nm (ref >20.5)
LDL-C (NIH Calc): 208 mg/dL — ABNORMAL HIGH (ref 0–99)
LP-IR Score: <25 (ref <=45)
Small LDL Particle Number: <90 nmol/L (ref <=527)
Triglycerides: 53 mg/dL (ref 0–149)

## 2022-09-11 LAB — LIPOPROTEIN A (LPA): Lipoprotein (a): 33.8 nmol/L (ref <75.0)

## 2022-09-14 ENCOUNTER — Other Ambulatory Visit: Payer: Self-pay | Admitting: *Deleted

## 2022-09-14 DIAGNOSIS — E7800 Pure hypercholesterolemia, unspecified: Secondary | ICD-10-CM

## 2022-09-14 NOTE — Progress Notes (Signed)
LPa - low at 33.8  LDL particle number 1982 high, LDL count 208 high, Total chol 307 high  LDL size 21.5 (larger)   Did not wish to take statin. See clinic note for details.   Will refer to our lipid specialist for further review, Dr. Rennis Golden.  Donato Schultz, MD

## 2022-09-17 ENCOUNTER — Ambulatory Visit (INDEPENDENT_AMBULATORY_CARE_PROVIDER_SITE_OTHER): Payer: Self-pay

## 2022-09-17 DIAGNOSIS — E7800 Pure hypercholesterolemia, unspecified: Secondary | ICD-10-CM

## 2022-11-10 ENCOUNTER — Encounter: Payer: 59 | Admitting: Internal Medicine

## 2022-12-20 ENCOUNTER — Encounter: Payer: 59 | Admitting: Internal Medicine

## 2022-12-21 ENCOUNTER — Ambulatory Visit (AMBULATORY_SURGERY_CENTER): Payer: Medicaid Other | Admitting: *Deleted

## 2022-12-21 VITALS — Ht 65.0 in | Wt 135.0 lb

## 2022-12-21 DIAGNOSIS — Z1211 Encounter for screening for malignant neoplasm of colon: Secondary | ICD-10-CM

## 2022-12-21 MED ORDER — PEG 3350-KCL-NA BICARB-NACL 420 G PO SOLR: 4000.0000 mL | Freq: Once | ORAL | 0 refills | Status: AC

## 2022-12-21 MED ORDER — SUTAB 1479-225-188 MG PO TABS: ORAL_TABLET | ORAL | 0 refills | Status: DC

## 2022-12-21 NOTE — Progress Notes (Signed)
No egg or soy allergy known to patient  No issues known to pt with past sedation with any surgeries or procedures Patient denies ever being told they had issues or difficulty with intubation  No FH of Malignant Hyperthermia Pt is not on diet pills Pt is not on  home 02  Pt is not on blood thinners  Pt has issues with constipation ,admitted having bowel movement 2 x a week with,2 day prep given then pt.wanted tablets explained that insurance may not cover,pt.requested that both prep be sent into pharmacy offered zofran,pt.still wanted sutab,both rx sent in per request. No A fib or A flutter Have any cardiac testing pending--no Pt instructed to use Singlecare.com or GoodRx for a price reduction on prep    Patient's chart reviewed by Theresa Payne CNRA prior to previsit and patient appropriate for the [place].  Previsit completed and red dot placed by patient's name on their procedure day (on provider's schedule).

## 2023-01-20 ENCOUNTER — Encounter: Payer: Self-pay | Admitting: Internal Medicine

## 2023-01-20 ENCOUNTER — Ambulatory Visit: Payer: Medicaid Other | Admitting: Internal Medicine

## 2023-01-20 VITALS — BP 113/68 | HR 73 | Temp 98.4°F | Resp 22 | Ht 65.0 in | Wt 135.0 lb

## 2023-01-20 DIAGNOSIS — K6389 Other specified diseases of intestine: Secondary | ICD-10-CM

## 2023-01-20 DIAGNOSIS — Z1211 Encounter for screening for malignant neoplasm of colon: Secondary | ICD-10-CM

## 2023-01-20 DIAGNOSIS — D122 Benign neoplasm of ascending colon: Secondary | ICD-10-CM

## 2023-01-20 MED ORDER — SODIUM CHLORIDE 0.9 % IV SOLN: 500.0000 mL | INTRAVENOUS | Status: DC

## 2023-01-20 NOTE — Progress Notes (Signed)
Called to room to assist during endoscopic procedure.  Patient ID and intended procedure confirmed with present staff. Received instructions for my participation in the procedure from the performing physician.  

## 2023-01-20 NOTE — Progress Notes (Signed)
GASTROENTEROLOGY PROCEDURE H&P NOTE   Primary Care Physician: Pcp, No    Reason for Procedure:   Colon cancer screening  Plan:    Colonoscopy  Patient is appropriate for endoscopic procedure(s) in the ambulatory ([facility]) setting.  The nature of the procedure, as well as the risks, benefits, and alternatives were carefully and thoroughly reviewed with the patient. Ample time for discussion and questions allowed. The patient understood, was satisfied, and agreed to proceed.     HPI: Theresa Payne is a 52 y.o. female who presents for colonoscopy for colon cancer screening. Denies blood in stools, changes in bowel habits, or unintentional weight loss. Denies family history of colon cancer.  Past Medical History:  Diagnosis Date   Allergy    seasonal   Arthritis    hip   DM (diabetes mellitus)    "pt.denies" updated 12/21/22   Headache(784.0)    History of bronchitis    many of yrs ago   Hypercholesterolemia    Joint pain    Joint swelling    Osteopenia     Past Surgical History:  Procedure Laterality Date   ABLATION     uterine   HIP ARTHROSCOPY Left 01/24/2013   Procedure: ARTHROSCOPY HIP with labral tear debridment and chondroplasty;  Surgeon: Loreta Ave, MD;  Location[facility] OR;  Service: Orthopedics;  Laterality: Left;    Prior to Admission medications   Medication Sig Start Date End Date Taking? Authorizing Provider  Ascorbic Acid (VITAMIN C) 1000 MG tablet Take 1,000 mg by mouth daily.   Yes [provider]  Magnesium 200 MG TABS Take by mouth. Take ever other day   Yes [provider]  Riboflavin (VITAMIN B2 PO) Take 36.5 mg by mouth.   Yes [provider]  thiamine (VITAMIN B-1) 50 MG tablet Take 50 mg by mouth daily.   Yes [provider]    Current Outpatient Medications  Medication Sig Dispense Refill   Ascorbic Acid (VITAMIN C) 1000 MG tablet Take 1,000 mg by mouth daily.     Magnesium 200 MG TABS Take by mouth.  Take ever other day     Riboflavin (VITAMIN B2 PO) Take 36.5 mg by mouth.     thiamine (VITAMIN B-1) 50 MG tablet Take 50 mg by mouth daily.     Current Facility-Administered Medications  Medication Dose Route Frequency Provider Last Rate Last Admin   0.9 %  sodium chloride infusion  500 mL Intravenous Continuous Imogene Burn, MD        Allergies as of 01/20/2023 - Review Complete 01/20/2023  Allergen Reaction Noted   Shellfish allergy Anaphylaxis 01/09/2013   Codeine Itching and Other (See Comments) 01/09/2013    Family History  Problem Relation Age of Onset   Hypercholesterolemia Mother    Dementia Mother    Osteoporosis Mother    Colitis Mother    Hypercholesterolemia Father    Hypercholesterolemia Brother    Osteoporosis Maternal Grandmother    Dementia Maternal Grandmother    Diabetes Maternal Grandfather    Heart disease Maternal Grandfather    Dementia Paternal Grandfather    Colon cancer Neg Hx    Colon polyps Neg Hx    Crohn's disease Neg Hx    Esophageal cancer Neg Hx    Rectal cancer Neg Hx    Stomach cancer Neg Hx     Social History   Socioeconomic History   Marital status: Divorced    Spouse name: Not on  file   Number of children: Not on file   Years of education: Not on file   Highest education level: Not on file  Occupational History   Not on file  Tobacco Use   Smoking status: Former   Smokeless tobacco: Never   Tobacco comments:    quit smoking 27yrs ago  Vaping Use   Vaping Use: Never used  Substance and Sexual Activity   Alcohol use: Yes    Comment: occasionally   Drug use: No   Sexual activity: Never  Other Topics Concern   Not on file  Social History Narrative   Not on file   Social Determinants of Health   Financial Resource Strain: Not on file  Food Insecurity: Not on file  Transportation Needs: Not on file  Physical Activity: Not on file  Stress: Not on file  Social Connections: Not on file  Intimate Partner Violence:  Not on file    Physical Exam: Vital signs in last 24 hours: BP (!) 151/102   Pulse 73   Ht  (1.651 m)   Wt 135 lb (61.2 kg)   SpO2 98%   BMI 22.47 kg/m  GEN: NAD EYE: Sclerae anicteric ENT: MMM CV: Non-tachycardic Pulm: No increased work of breathing GI: Soft, NT/ND NEURO:  Alert & Oriented   Eulah Pont, MD [facility]  01/20/2023 9:06 AM

## 2023-01-20 NOTE — Progress Notes (Signed)
Patient reports no changes to health or medications since pre visit. 

## 2023-01-20 NOTE — Op Note (Signed)
[facility] Patient Name: Theresa Payne Procedure Date: 01/20/2023 9:53 AM MRN: [id number] Endoscopist: Madelyn Brunner [place] , , [id number] Age: 52 Referring MD:  Date of Birth: 10/03/1970 Gender: Female Account #: [id number] Procedure:                Colonoscopy Indications:              Screening for colorectal malignant neoplasm, This                            is the patient's first colonoscopy Medicines:                Monitored Anesthesia Care Procedure:                Pre-Anesthesia Assessment:                           - Prior to the procedure, a History and Physical                            was performed, and patient medications and                            allergies were reviewed. The patient's tolerance of                            previous anesthesia was also reviewed. The risks                            and benefits of the procedure and the sedation                            options and risks were discussed with the patient.                            All questions were answered, and informed consent                            was obtained. Prior Anticoagulants: The patient has                            taken no anticoagulant or antiplatelet agents. ASA                            Grade Assessment: II - A patient with mild systemic                            disease. After reviewing the risks and benefits,                            the patient was deemed in satisfactory condition to                            undergo the procedure.  After obtaining informed consent, the colonoscope                            was passed under direct vision. Throughout the                            procedure, the patient's blood pressure, pulse, and                            oxygen saturations were monitored continuously. The                            Olympus PCF-H190DL (#[id number]) Colonoscope was                            introduced through the  anus and advanced to the the                            cecum, identified by appendiceal orifice and                            ileocecal valve. The colonoscopy was performed                            without difficulty. The patient tolerated the                            procedure well. The quality of the bowel                            preparation was good. The ileocecal valve,                            appendiceal orifice, and rectum were photographed. Scope In: 10:01:45 AM Scope Out: 10:16:03 AM Scope Withdrawal Time: 0 hours 9 minutes 45 seconds  Total Procedure Duration: 0 hours 14 minutes 18 seconds  Findings:                 A 3 mm polyp was found in the sigmoid colon. The                            polyp was sessile. The polyp was removed with a                            cold snare. Resection and retrieval were complete.                           Non-bleeding internal hemorrhoids were found during                            retroflexion. Complications:            No immediate complications. Estimated Blood Loss:     Estimated blood loss was minimal. Impression:               -  One 3 mm polyp in the sigmoid colon, removed with                            a cold snare. Resected and retrieved.                           - Non-bleeding internal hemorrhoids. Recommendation:           - Discharge patient to home (with escort).                           - Await pathology results.                           - The findings and recommendations were discussed                            with the patient. Dr Particia Lather "[place]" [employer] [facility],  01/20/2023 10:18:49 AM

## 2023-01-20 NOTE — Patient Instructions (Signed)
Thank you for letting us take care of your healthcare needs today. Please see handouts given to you on Polyps and Hemorrhoids.    YOU HAD AN ENDOSCOPIC PROCEDURE TODAY AT THE [facility]:   Refer to the procedure report that was given to you for any specific questions about what was found during the examination.  If the procedure report does not answer your questions, please call your gastroenterologist to clarify.  If you requested that your care partner not be given the details of your procedure findings, then the procedure report has been included in a sealed envelope for you to review at your convenience later.  YOU SHOULD EXPECT: Some feelings of bloating in the abdomen. Passage of more gas than usual.  Walking can help get rid of the air that was put into your GI tract during the procedure and reduce the bloating. If you had a lower endoscopy (such as a colonoscopy or flexible sigmoidoscopy) you may notice spotting of blood in your stool or on the toilet paper. If you underwent a bowel prep for your procedure, you may not have a normal bowel movement for a few days.  Please Note:  You might notice some irritation and congestion in your nose or some drainage.  This is from the oxygen used during your procedure.  There is no need for concern and it should clear up in a day or so.  SYMPTOMS TO REPORT IMMEDIATELY:  Following lower endoscopy (colonoscopy or flexible sigmoidoscopy):  Excessive amounts of blood in the stool  Significant tenderness or worsening of abdominal pains  Swelling of the abdomen that is new, acute  Fever of 100F or higher   For urgent or emergent issues, a gastroenterologist can be reached at any hour by calling [phone]. Do not use MyChart messaging for urgent concerns.    DIET:  We do recommend a small meal at first, but then you may proceed to your regular diet.  Drink plenty of fluids but you should avoid alcoholic beverages for 24  hours.  ACTIVITY:  You should plan to take it easy for the rest of today and you should NOT DRIVE or use heavy machinery until tomorrow (because of the sedation medicines used during the test).    FOLLOW UP: Our staff will call the number listed on your records the next business day following your procedure.  We will call around 7:15- 8:00 am to check on you and address any questions or concerns that you may have regarding the information given to you following your procedure. If we do not reach you, we will leave a message.     If any biopsies were taken you will be contacted by phone or by letter within the next 1-3 weeks.  Please call us at [phone] if you have not heard about the biopsies in 3 weeks.    SIGNATURES/CONFIDENTIALITY: You and/or your care partner have signed paperwork which will be entered into your electronic medical record.  These signatures attest to the fact that that the information above on your After Visit Summary has been reviewed and is understood.  Full responsibility of the confidentiality of this discharge information lies with you and/or your care-partner. 

## 2023-01-20 NOTE — Progress Notes (Signed)
Uneventful anesthetic. Report to pacu rn. Vss. Care resumed by rn. 

## 2023-01-21 ENCOUNTER — Telehealth: Payer: Self-pay | Admitting: *Deleted

## 2023-01-21 NOTE — Telephone Encounter (Signed)
  Follow up Call-     01/20/2023    9:06 AM  Call back number  Post procedure Call Back phone  # [phone]  Permission to leave phone message Yes    Post procedure follow up phone call. No answer at number given.  Left message on voicemail.

## 2023-02-09 ENCOUNTER — Encounter: Payer: Self-pay | Admitting: Internal Medicine

## 2023-03-14 ENCOUNTER — Ambulatory Visit: Payer: Medicaid Other | Attending: Internal Medicine | Admitting: Internal Medicine

## 2023-03-14 ENCOUNTER — Encounter: Payer: Self-pay | Admitting: Internal Medicine

## 2023-03-14 VITALS — BP 107/81 | HR 64 | Ht 65.0 in | Wt 135.0 lb

## 2023-03-14 DIAGNOSIS — E7800 Pure hypercholesterolemia, unspecified: Secondary | ICD-10-CM | POA: Diagnosis not present

## 2023-03-14 NOTE — Progress Notes (Signed)
Virtual Visit via Video Note   Because of [name]'s co-morbid illnesses, she is at least at moderate risk for complications without adequate follow up.  This format is felt to be most appropriate for this patient at this time.  All issues noted in this document were discussed and addressed.  A limited physical exam was performed with this format.  Please refer to the patient's chart for her consent to telehealth for [facility].      Date:  03/14/2023   ID:  Theresa Payne, DOB 08/09/1971, MRN [id number] The patient was identified using 2 identifiers.  Evaluation Performed:  New Patient Evaluation  Patient Location:  [address] [place] [address]  Provider location:   [address], [address] [place], [place] [address]  PCP:  Oneita Hurt, No  Cardiologist:  Donato Schultz, MD Electrophysiologist:  None   Chief Complaint:  Manage dyslipidemia  History of Present Illness:    Theresa Payne is a 52 y.o. female who presents via audio/video conferencing for a telehealth visit today.  This is a pleasant female referred by Dr. Anne Fu for evaluation management of dyslipidemia.  She has had cholesterol testing by her gynecologist Dr. Rana Snare, which has recently at the end of 2023 demonstrated LDL greater than 190.  She was referred to cardiology and saw Dr. Anne Fu.  He recommended additional testing including an advanced lipid profile, LP(a) and coronary CT calcium score.  Her calcium score came back as 0 without any significant vascular findings.  LP(a) was negative at 33.8 nmol/L.  Her lipid NMR showed a particle number of 1982, LDL-C of 208, HDL 92 and triglycerides 53.  Total cholesterol 307.  Her small LDL particle number however was very low less than 90.  LDL size was large at 21.5 nm.  She reports multiple family members with high cholesterol who are taking statins including her brother who did have some evidence of early onset coronary artery calcification who is  also on a statin.  She is hesitant to take statins and expressed that she has extensively researched the subject and feels that there may even be some benefit for repairing blood vessels from inflammation with cholesterol.  She reports eating a low inflammatory diet as well as a ketogenic diet that does include more liberal saturated fats although she does not go out of her way to add saturated fats to the diet.  She is asked for additional detailed lab work and testing evaluating her menopausal status, thyroid, vitamin levels, etc. she is also interested in carotid Dopplers to further assess for cardiovascular disease and we discussed the possibility of genetic testing today to determine if she has a familial hyperlipidemia and she would be interested in that as well.  Prior CV studies:   The following studies were reviewed today:  Chart reviewed, lab work, imaging reviewed  PMHx:  Past Medical History:  Diagnosis Date   Allergy    seasonal   Arthritis    hip   DM (diabetes mellitus) (HCC)    "pt.denies" updated 12/21/22   Headache(784.0)    History of bronchitis    many of yrs ago   Hypercholesterolemia    Joint pain    Joint swelling    Osteopenia     Past Surgical History:  Procedure Laterality Date   ABLATION     uterine   HIP ARTHROSCOPY Left 01/24/2013   Procedure: ARTHROSCOPY HIP with labral tear debridment and chondroplasty;  Surgeon: Reuel Boom  Georg Ruddle, MD;  Location: [facility] OR;  Service: Orthopedics;  Laterality: Left;    FAMHx:  Family History  Problem Relation Age of Onset   Hypercholesterolemia Mother    Dementia Mother    Osteoporosis Mother    Colitis Mother    Hypercholesterolemia Father    Hypercholesterolemia Brother    Osteoporosis Maternal Grandmother    Dementia Maternal Grandmother    Diabetes Maternal Grandfather    Heart disease Maternal Grandfather    Dementia Paternal Grandfather    Colon cancer Neg Hx    Colon polyps Neg Hx    Crohn's disease Neg  Hx    Esophageal cancer Neg Hx    Rectal cancer Neg Hx    Stomach cancer Neg Hx     SOCHx:   reports that she has quit smoking. She has never used smokeless tobacco. She reports current alcohol use. She reports that she does not use drugs.  ALLERGIES:  Allergies  Allergen Reactions   Shellfish Allergy Anaphylaxis   Codeine Itching and Other (See Comments)    REACTION:  hyperactivity    MEDS:  Current Meds  Medication Sig   Ascorbic Acid (VITAMIN C) 1000 MG tablet Take 1,000 mg by mouth daily.   Magnesium 200 MG TABS Take by mouth. Take ever other day   Riboflavin (VITAMIN B2 PO) Take 36.5 mg by mouth.   thiamine (VITAMIN B-1) 50 MG tablet Take 50 mg by mouth daily.     ROS: Pertinent items noted in HPI and remainder of comprehensive ROS otherwise negative.  Labs/Other Tests and Data Reviewed:    Recent Labs: No results found for requested labs within last 365 days.   Recent Lipid Panel No results found for: "CHOL", "TRIG", "HDL", "CHOLHDL", "LDLCALC", "LDLDIRECT"  Wt Readings from Last 3 Encounters:  03/14/23 135 lb (61.2 kg)  01/20/23 135 lb (61.2 kg)  12/21/22 135 lb (61.2 kg)     Exam:    Vital Signs:  BP 107/81   Pulse 64   Ht 5\' 5"  (1.651 m)   Wt 135 lb (61.2 kg)   BMI 22.47 kg/m    General appearance: alert and no distress Lungs: No audible wheezing Abdomen: Normal weight Extremities: extremities normal, atraumatic, no cyanosis or edema Neurologic: Grossly normal  ASSESSMENT & PLAN:    Hypercholesterolemia with LDL greater than 190 mg/dL, possible familial hyperlipidemia Multiple family members with high cholesterol Ketogenic diet 0 CAC score (2023) Statin averse  Ms. [name] has a high LDL cholesterol with increased particle numbers although her small dense LDL particle numbers are low and HDL is high.  She does eat a ketogenic diet with more liberal saturated fats which may explain her higher LDL cholesterol.  She had no coronary calcium which  is somewhat reassuring although at her age she may have noncalcified cardiovascular disease.  Per guidelines lipid-lowering therapy primarily with statins is recommended including high intensity statin therapy with greater than 50% reduction to an LDL less than 70 mg/dL.  She is hesitant to consider therapy and would like additional testing as to the root cause of her disease.  We discussed the possible benefits of genetic testing, especially if we did identify a pathogenic variant.  Additionally she would like to have carotid Dopplers performed.  I discussed the National lipid Association recommendations against a ketogenic diet in patients with high cholesterol including data demonstrating earlier onset cardiovascular disease.  That information is available online through a [employer], multimedia.  We will additionally test  for other secondary causes of hypercholesterolemia including thyroid dysfunction, metabolic endocrinopathies, perimenopausal status, and vitamin deficiencies.  Plan follow-up afterwards with therapy as per their decision making.  Thanks as always for the kind referral.  Patient Risk:   After full review of this patients clinical status, I feel that they are at least moderate risk at this time.  Time:   Today, I have spent 25 minutes with the patient with telehealth technology discussing dyslipidemia.     Medication Adjustments/Labs and Tests Ordered: Current medicines are reviewed at length with the patient today.  Concerns regarding medicines are outlined above.   Tests Ordered: Orders Placed This Encounter  Procedures   TSH   T4, free   T3   High sensitivity CRP   Prolactin   FSH   Vitamin D (25 hydroxy)   Insulin and C-Peptide   VAS US CAROTID    Medication Changes: No orders of the defined types were placed in this encounter.   Disposition:  in 4 month(s)  Chrystie Nose, MD, [facility], FACP  [place]  [facility]  Medical Director of the Advanced Lipid Disorders  &  Cardiovascular Risk Reduction Clinic Diplomate of the American Board of Clinical Lipidology Attending Cardiologist  Direct Dial: [phone]  Fax: [phone]  Website:  [email or web address]  Chrystie Nose, MD  03/14/2023 8:46 AM

## 2023-03-14 NOTE — Patient Instructions (Addendum)
Medication Instructions:  NO CHANGES  *If you need a refill on your cardiac medications before your next appointment, please call your pharmacy*   Lab Work: Non-Fasting lab work at Dr. Blanchie Dessert office OR any other [facility]  TSH, Free T4, T3, FSH, Prolactin, Vit D, Insulin Free/Total, C-Peptide, HS-CRP  If you have labs (blood work) drawn today and your tests are completely normal, you will receive your results only by: MyChart Message (if you have MyChart) OR A paper copy in the mail If you have any lab test that is abnormal or we need to change your treatment, we will call you to review the results.   Testing/Procedures: Genetic Test -- cheek swab at Dr. Blanchie Dessert office  Carotid Doppler at Dr. Blanchie Dessert office   Follow-Up: At [facility], you and your health needs are our priority.  As part of our continuing mission to provide you with exceptional heart care, we have created designated Provider Care Teams.  These Care Teams include your primary Cardiologist (physician) and Advanced Practice Providers (APPs -  Physician Assistants and Nurse Practitioners) who all work together to provide you with the care you need, when you need it.  We recommend signing up for the patient portal called "MyChart".  Sign up information is provided on this After Visit Summary.  MyChart is used to connect with patients for Virtual Visits (Telemedicine).  Patients are able to view lab/test results, encounter notes, upcoming appointments, etc.  Non-urgent messages can be sent to your provider as well.   To learn more about what you can do with MyChart, go to [email or web address].    Your next appointment:    4 months with Dr. Rennis Golden

## 2023-03-15 ENCOUNTER — Telehealth: Payer: Self-pay | Admitting: Internal Medicine

## 2023-03-15 DIAGNOSIS — Z1321 Encounter for screening for nutritional disorder: Secondary | ICD-10-CM

## 2023-03-15 DIAGNOSIS — E7800 Pure hypercholesterolemia, unspecified: Secondary | ICD-10-CM

## 2023-03-15 DIAGNOSIS — L608 Other nail disorders: Secondary | ICD-10-CM

## 2023-03-15 NOTE — Telephone Encounter (Signed)
Left message to call back to schedule nurse visit for genetic test.  Will need to arrange on 6/24 AM if possible.  MyChart message also sent

## 2023-03-21 NOTE — Addendum Note (Signed)
Addended by: Lindell Spar on: 03/21/2023 10:01 AM   Modules accepted: Orders

## 2023-03-21 NOTE — Addendum Note (Signed)
Addended by: Lindell Spar on: 03/21/2023 11:23 AM   Modules accepted: Orders

## 2023-03-21 NOTE — Telephone Encounter (Signed)
Patient requested additional labs via MyChart message  Response from Dr. Rennis Golden: Rennis Golden, Lisette Abu, MD  Lindell Spar, RN Caller: Unspecified (6 days ago, 11:47 AM) She can have the additional labs - as long as she knows she may have to pay for them if insurance does not cover.  -[place]

## 2023-03-25 ENCOUNTER — Ambulatory Visit (HOSPITAL_COMMUNITY)
Admission: RE | Admit: 2023-03-25 | Discharge: 2023-03-25 | Disposition: A | Discharge status: Home / Self Care | Payer: Medicaid Other | Source: Ambulatory Visit | Attending: Internal Medicine | Admitting: Internal Medicine

## 2023-03-25 DIAGNOSIS — E7800 Pure hypercholesterolemia, unspecified: Secondary | ICD-10-CM | POA: Insufficient documentation

## 2023-03-28 ENCOUNTER — Ambulatory Visit: Payer: Medicaid Other | Attending: Cardiology | Admitting: *Deleted

## 2023-03-28 DIAGNOSIS — E7800 Pure hypercholesterolemia, unspecified: Secondary | ICD-10-CM

## 2023-03-28 NOTE — Progress Notes (Signed)
   Nurse Visit   Date of Encounter: 03/28/2023 ID: Theresa Payne, DOB [birth year], MRN [id number]  PCP:  Pcp, No   [facility] Providers Cardiologist/Lipid Specialist: Zoila Shutter MD    Visit Details   VS:  There were no vitals taken for this visit. , BMI There is no height or weight on file to calculate BMI.  Wt Readings from Last 3 Encounters:  03/14/23 135 lb (61.2 kg)  01/20/23 135 lb (61.2 kg)  12/21/22 135 lb (61.2 kg)     Reason for visit: genetic test - dyslipidemia/ASCVD panel Performed today: Education - informed about test turnaround, overview of what is tested, how results will be communicated  Changes (medications, testing, etc.) : N/A Length of Visit: 10 minutes    Medications Adjustments/Labs and Tests Ordered: No orders of the defined types were placed in this encounter.  No orders of the defined types were placed in this encounter.    Signed, Lindell Spar, RN  03/28/2023 9:56 AM

## 2023-03-29 LAB — T3: T3, Total: 51 ng/dL — ABNORMAL LOW (ref 71–180)

## 2023-03-29 LAB — FOLATE RBC

## 2023-03-29 LAB — IRON,TIBC AND FERRITIN PANEL
Iron: 95 ug/dL (ref 27–159)
UIBC: 175 ug/dL (ref 131–425)

## 2023-03-29 LAB — T3, REVERSE

## 2023-03-29 LAB — SPECIMEN STATUS REPORT

## 2023-03-29 LAB — THYROID ANTIBODIES

## 2023-03-29 LAB — VITAMIN B3

## 2023-03-29 LAB — TSH: TSH: 2.98 u[IU]/mL (ref 0.450–4.500)

## 2023-03-29 LAB — HIGH SENSITIVITY CRP: CRP, High Sensitivity: 4.85 mg/L — ABNORMAL HIGH (ref 0.00–3.00)

## 2023-03-29 LAB — INSULIN, FREE AND TOTAL

## 2023-03-29 LAB — PROLACTIN: Prolactin: 9 ng/mL (ref 3.6–25.2)

## 2023-03-29 LAB — T4, FREE: Free T4: 0.93 ng/dL (ref 0.82–1.77)

## 2023-03-29 LAB — FOLLICLE STIMULATING HORMONE: FSH: 36.6 m[IU]/mL

## 2023-03-29 LAB — VITAMIN B5

## 2023-03-29 LAB — VITAMIN D 25 HYDROXY (VIT D DEFICIENCY, FRACTURES): Vit D, 25-Hydroxy: 64.1 ng/mL (ref 30.0–100.0)

## 2023-03-30 LAB — VITAMIN B6: Vitamin B6: 17.9 ug/L (ref 3.4–65.2)

## 2023-03-30 LAB — IRON,TIBC AND FERRITIN PANEL
Ferritin: 156 ng/mL — ABNORMAL HIGH (ref 15–150)
Iron Saturation: 35 % (ref 15–55)
Total Iron Binding Capacity: 270 ug/dL (ref 250–450)

## 2023-03-30 LAB — THYROID ANTIBODIES: Thyroperoxidase Ab SerPl-aCnc: 12 IU/mL (ref 0–34)

## 2023-03-31 LAB — VITAMIN B7: Vitamin B7: 0.07 ng/mL (ref 0.05–0.83)

## 2023-03-31 LAB — ZINC: Zinc: 127 ug/dL — ABNORMAL HIGH (ref 44–115)

## 2023-03-31 LAB — FOLATE RBC: Hematocrit: 42 % (ref 34.0–46.6)

## 2023-04-04 LAB — INSULIN, FREE AND TOTAL: Free Insulin: 1.9 uU/mL

## 2023-04-04 LAB — C-PEPTIDE: C-Peptide: 0.9 ng/mL — ABNORMAL LOW (ref 1.1–4.4)

## 2023-04-07 LAB — VITAMIN B3: Nicotinic Acid: <5 ng/mL (ref 0.0–5.0)

## 2023-04-07 LAB — FOLATE RBC: Hematocrit: 42 % (ref 34.0–46.6)

## 2023-04-07 LAB — VITAMIN B12: Vitamin B-12: 1246 pg/mL — ABNORMAL HIGH (ref 232–1245)

## 2023-04-14 ENCOUNTER — Ambulatory Visit: Payer: Medicaid Other | Attending: Internal Medicine | Admitting: *Deleted

## 2023-04-14 DIAGNOSIS — E7800 Pure hypercholesterolemia, unspecified: Secondary | ICD-10-CM

## 2023-04-14 NOTE — Progress Notes (Signed)
Patient presents for nurse visit for genetic test.  Testing company: [employer] Test ID: [id number] Test: dyslipidemia/ASCVD panel  No med changes No vitals Cheek swab performed for testing

## 2023-05-06 ENCOUNTER — Encounter: Payer: Self-pay | Admitting: Internal Medicine

## 2023-08-08 ENCOUNTER — Encounter: Payer: Self-pay | Admitting: Internal Medicine

## 2023-08-15 ENCOUNTER — Ambulatory Visit: Payer: Medicaid Other | Admitting: Dermatology

## 2023-08-15 ENCOUNTER — Encounter: Payer: Self-pay | Admitting: Dermatology

## 2023-08-15 VITALS — BP 117/77 | HR 69

## 2023-08-15 DIAGNOSIS — L578 Other skin changes due to chronic exposure to nonionizing radiation: Secondary | ICD-10-CM | POA: Diagnosis not present

## 2023-08-15 DIAGNOSIS — W908XXA Exposure to other nonionizing radiation, initial encounter: Secondary | ICD-10-CM

## 2023-08-15 DIAGNOSIS — D229 Melanocytic nevi, unspecified: Secondary | ICD-10-CM

## 2023-08-15 DIAGNOSIS — Z1283 Encounter for screening for malignant neoplasm of skin: Secondary | ICD-10-CM

## 2023-08-15 DIAGNOSIS — Z808 Family history of malignant neoplasm of other organs or systems: Secondary | ICD-10-CM

## 2023-08-15 DIAGNOSIS — L821 Other seborrheic keratosis: Secondary | ICD-10-CM | POA: Diagnosis not present

## 2023-08-15 DIAGNOSIS — L814 Other melanin hyperpigmentation: Secondary | ICD-10-CM

## 2023-08-15 DIAGNOSIS — D1801 Hemangioma of skin and subcutaneous tissue: Secondary | ICD-10-CM

## 2023-08-15 NOTE — Progress Notes (Signed)
   New Patient Visit   Subjective  Theresa Payne is a 52 y.o. female who presents for the following: Skin Cancer Screening and Full Body Skin Exam  The patient presents for Total-Body Skin Exam (TBSE) for skin cancer screening and mole check. The patient has spots, moles and lesions to be evaluated, some may be new or changing. Pt has no hx of skin cancer but father just dx w MM to brain, with V600E mutation and CDKN2A  The following portions of the chart were reviewed this encounter and updated as appropriate: medications, allergies, medical history  Review of Systems:  No other skin or systemic complaints except as noted in HPI or Assessment and Plan.  Objective  Well appearing patient in no apparent distress; mood and affect are within normal limits.  A full examination was performed including scalp, head, eyes, ears, nose, lips, neck, chest, axillae, abdomen, back, buttocks, bilateral upper extremities, bilateral lower extremities, hands, feet, fingers, toes, fingernails, and toenails. All findings within normal limits unless otherwise noted below.   Relevant physical exam findings are noted in the Assessment and Plan.   Assessment & Plan   SKIN CANCER SCREENING PERFORMED TODAY.  ACTINIC DAMAGE - Chronic condition, secondary to cumulative UV/sun exposure - diffuse scaly erythematous macules with underlying dyspigmentation - Recommend daily broad spectrum sunscreen SPF 30+ to sun-exposed areas, reapply every 2 hours as needed.  - Staying in the shade or wearing long sleeves, sun glasses (UVA+UVB protection) and wide brim hats (4-inch brim around the entire circumference of the hat) are also recommended for sun protection.  - Call for new or changing lesions.  MELANOCYTIC NEVI - Tan-brown and/or pink-flesh-colored symmetric macules and papules - Benign appearing on exam today - Observation - Call clinic for new or changing moles - Recommend daily use of broad spectrum spf 30+  sunscreen to sun-exposed areas.   SEBORRHEIC KERATOSIS - Stuck-on, waxy, tan-brown papules and/or plaques  - Benign-appearing - Discussed benign etiology and prognosis. - Observe - Call for any changes  LENTIGINES Exam: scattered tan macules Due to sun exposure Treatment Plan: Benign-appearing, observe. Recommend daily broad spectrum sunscreen SPF 30+ to sun-exposed areas, reapply every 2 hours as needed.  Call for any changes    HEMANGIOMA Exam: red papule(s) Discussed benign nature. Recommend observation. Call for changes.   Family History of Melanoma - Recommend yearly skin checks   Return in about 1 year (around 08/14/2024) for TBSE.  I, Tillie Fantasia, CMA, am acting as scribe for Gwenith Daily, MD.   Documentation: I have reviewed the above documentation for accuracy and completeness, and I agree with the above.  Gwenith Daily, MD

## 2023-08-15 NOTE — Patient Instructions (Signed)

## 2024-06-26 ENCOUNTER — Ambulatory Visit: Admission: RE | Admit: 2024-06-26 | Discharge: 2024-06-26 | Disposition: A | Discharge status: Home / Self Care

## 2024-06-26 VITALS — BP 115/81 | HR 70 | Temp 98.2°F

## 2024-06-26 DIAGNOSIS — R0981 Nasal congestion: Secondary | ICD-10-CM

## 2024-06-26 DIAGNOSIS — J0100 Acute maxillary sinusitis, unspecified: Secondary | ICD-10-CM | POA: Diagnosis not present

## 2024-06-26 MED ORDER — AMOXICILLIN-POT CLAVULANATE 875-125 MG PO TABS: 1.0000 | ORAL_TABLET | Freq: Two times a day (BID) | ORAL | 0 refills | Status: AC

## 2024-06-26 NOTE — ED Triage Notes (Signed)
 Pt c/o nasal congestion, facial pain and HA x 1 month. Denies fever. Taking sudafed, Afrin and benedryl/zyrtec prn. Hx of sinus infections.

## 2024-06-26 NOTE — ED Provider Notes (Signed)
 Theresa Payne    CSN: [id number] Arrival date & time: 06/26/24  9075      History   Chief Complaint Chief Complaint  Patient presents with   Headache    Suspected sinus infection    Facial Pain   Nasal Congestion    HPI Theresa Payne is a 53 y.o. female.   HPI 53 year old female presents with headache and sinus nasal congestion for 1 month.  PMH significant for headache, hypercholesterolemia, and osteopenia.  Past Medical History:  Diagnosis Date   Allergy    seasonal   Arthritis    hip   DM (diabetes mellitus) (HCC)    pt.denies updated 12/21/22   Headache(784.0)    History of bronchitis    many of yrs ago   Hypercholesterolemia    Joint pain    Joint swelling    Osteopenia     There are no active problems to display for this patient.   Past Surgical History:  Procedure Laterality Date   ABLATION     uterine   HIP ARTHROSCOPY Left 01/24/2013   Procedure: ARTHROSCOPY HIP with labral tear debridment and chondroplasty;  Surgeon: Toribio JULIANNA Chancy, MD;  Location: [facility] OR;  Service: Orthopedics;  Laterality: Left;    OB History   No obstetric history on file.      Home Medications    Prior to Admission medications   Medication Sig Start Date End Date Taking? Authorizing Provider  amoxicillin-clavulanate (AUGMENTIN) 875-125 MG tablet Take 1 tablet by mouth 2 (two) times daily for 10 days. 06/26/24 07/06/24 Yes Teddy Sharper, FNP  estradiol (ESTRACE) 2 MG tablet Take 2 mg by mouth daily. 06/09/23  Yes [provider]  progesterone (PROMETRIUM) 200 MG capsule  06/29/23  Yes [provider]  Ascorbic Acid (VITAMIN C) 1000 MG tablet Take 1,000 mg by mouth daily.    [provider]  Magnesium 200 MG TABS Take by mouth. Take ever other day    [provider]  Riboflavin (VITAMIN B2 PO) Take 36.5 mg by mouth.    [provider]  thiamine (VITAMIN B-1) 50 MG tablet Take 50 mg by mouth daily.    [provider]    Family History Family History  Problem Relation Age of Onset   Hypercholesterolemia Mother    Dementia Mother    Osteoporosis Mother    Colitis Mother    Hypercholesterolemia Father    Hypercholesterolemia Brother    Osteoporosis Maternal Grandmother    Dementia Maternal Grandmother    Diabetes Maternal Grandfather    Heart disease Maternal Grandfather    Dementia Paternal Grandfather    Colon cancer Neg Hx    Colon polyps Neg Hx    Crohn's disease Neg Hx    Esophageal cancer Neg Hx    Rectal cancer Neg Hx    Stomach cancer Neg Hx     Social History Social History   Tobacco Use   Smoking status: Former   Smokeless tobacco: Never   Tobacco comments:    quit smoking 55yrs ago  Vaping Use   Vaping status: Never Used  Substance Use Topics   Alcohol use: Yes    Comment: occasionally   Drug use: No     Allergies   Shellfish allergy and Codeine   Review of Systems Review of Systems  HENT:  Positive for congestion.   Neurological:  Positive for headaches.     Physical Exam Triage Vital Signs ED Triage Vitals  Encounter Vitals Group     BP      Girls Systolic BP Percentile      Girls Diastolic BP Percentile      Boys Systolic BP Percentile      Boys Diastolic BP Percentile      Pulse      Resp      Temp      Temp src      SpO2      Weight      Height      Head Circumference      Peak Flow      Pain Score      Pain Loc      Pain Education      Exclude from Growth Chart    No data found.  Updated Vital Signs BP 115/81 (BP Location: Right Arm)   Pulse 70   Temp 98.2 F (36.8 C) (Oral)   SpO2 96%   Visual Acuity Right Eye Distance:   Left Eye Distance:   Bilateral Distance:    Right Eye Near:   Left Eye Near:    Bilateral Near:     Physical Exam Vitals and nursing note reviewed.  Constitutional:      General: She is not in acute distress.    Appearance: Normal appearance. She is normal weight. She is not  ill-appearing.  HENT:     Head: Normocephalic and atraumatic.     Right Ear: Tympanic membrane and external ear normal.     Left Ear: Tympanic membrane and external ear normal.     Ears:     Comments: Moderate eustachian tube dysfunction noted bilaterally    Nose:     Comments: Turbinates are erythematous/edematous    Mouth/Throat:     Mouth: Mucous membranes are moist.     Pharynx: Oropharynx is clear.  Eyes:     Extraocular Movements: Extraocular movements intact.     Conjunctiva/sclera: Conjunctivae normal.     Pupils: Pupils are equal, round, and reactive to light.  Cardiovascular:     Rate and Rhythm: Normal rate and regular rhythm.     Pulses: Normal pulses.     Heart sounds: Normal heart sounds.  Pulmonary:     Effort: Pulmonary effort is normal.     Breath sounds: Normal breath sounds. No wheezing, rhonchi or rales.  Musculoskeletal:        General: Normal range of motion.  Skin:    General: Skin is warm and dry.  Neurological:     General: No focal deficit present.     Mental Status: She is alert and oriented to person, place, and time. Mental status is at baseline.  Psychiatric:        Mood and Affect: Mood normal.        Behavior: Behavior normal.      UC Treatments / Results  Labs (all labs ordered are listed, but only abnormal results are displayed) Labs Reviewed - No data to display  EKG   Radiology No results found.  Procedures Procedures (including critical Payne time)  Medications Ordered in UC Medications - No data to display  Initial Impression / Assessment and Plan / UC Course  I have reviewed the triage vital signs and the nursing notes.  Pertinent labs & imaging results that were available during my Payne of the patient were reviewed by me and considered in my medical decision making (see chart for details).     MDM: 1.  Acute  maxillary sinusitis, recurrence not specified-Rx'd Augmentin 875/125 mg tablet: Take 1 tablet twice daily x 10  days. Advised patient to take medication as directed with food to completion.  Encouraged to increase daily water intake to 64 ounces per day while taking this medication.  Advised if symptoms worsen and/or unresolved please follow-up with your PCP or here for further evaluation.  Patient discharged home, hemodynamically stable. Final Clinical Impressions(s) / UC Diagnoses   Final diagnoses:  Acute maxillary sinusitis, recurrence not specified     Discharge Instructions      Advised patient to take medication as directed with food to completion.  Encouraged to increase daily water intake to 64 ounces per day while taking this medication.  Advised if symptoms worsen and/or unresolved please follow-up with your PCP or here for further evaluation.     ED Prescriptions     Medication Sig Dispense Auth. Provider   amoxicillin-clavulanate (AUGMENTIN) 875-125 MG tablet Take 1 tablet by mouth 2 (two) times daily for 10 days. 20 tablet Alaa Mullally, FNP      PDMP not reviewed this encounter.   Teddy Sharper, FNP 06/26/24 [phone]

## 2024-06-26 NOTE — Discharge Instructions (Addendum)
 Advised patient to take medication as directed with food to completion.  Encouraged to increase daily water intake to 64 ounces per day while taking this medication.  Advised if symptoms worsen and/or unresolved please follow-up with your PCP or here for further evaluation.

## 2024-06-27 ENCOUNTER — Telehealth: Payer: Self-pay

## 2024-06-27 NOTE — Telephone Encounter (Signed)
 Called to check on patient. Feeling better. No needs from ucc.

## 2024-07-04 ENCOUNTER — Inpatient Hospital Stay: Admission: RE | Admit: 2024-07-04

## 2024-08-29 ENCOUNTER — Ambulatory Visit

## 2024-08-29 ENCOUNTER — Encounter: Payer: Self-pay | Admitting: Urgent Care

## 2024-08-29 ENCOUNTER — Ambulatory Visit: Admitting: Urgent Care

## 2024-08-29 VITALS — BP 96/67 | HR 68 | Ht 65.0 in | Wt 167.0 lb

## 2024-08-29 DIAGNOSIS — R221 Localized swelling, mass and lump, neck: Secondary | ICD-10-CM | POA: Diagnosis not present

## 2024-08-29 DIAGNOSIS — R635 Abnormal weight gain: Secondary | ICD-10-CM | POA: Diagnosis not present

## 2024-08-29 DIAGNOSIS — T560X4A Toxic effect of lead and its compounds, undetermined, initial encounter: Secondary | ICD-10-CM

## 2024-08-29 DIAGNOSIS — M25552 Pain in left hip: Secondary | ICD-10-CM

## 2024-08-29 DIAGNOSIS — M25551 Pain in right hip: Secondary | ICD-10-CM

## 2024-08-29 DIAGNOSIS — J3489 Other specified disorders of nose and nasal sinuses: Secondary | ICD-10-CM

## 2024-08-29 MED ORDER — AMOXICILLIN-POT CLAVULANATE 875-125 MG PO TABS: 1.0000 | ORAL_TABLET | Freq: Two times a day (BID) | ORAL | 0 refills | Status: DC

## 2024-08-29 NOTE — Patient Instructions (Addendum)
 Go to [address] to obtain a hip xray. Schedule an US  of your neck.  Labs obtained today.  Follow up in 2 weeks, Please take Augmentin  2x/ day x 10 days.

## 2024-08-29 NOTE — Progress Notes (Unsigned)
 New Patient Office Visit  Subjective:  Patient ID: Theresa Payne, female    DOB: 21-Sep-1971  Age: 53 y.o. MRN: [id number]  CC:  Chief Complaint  Patient presents with   Establish Care    30lb weight gain; elevated lead level; antibiotics helped joint pain should she see rheumatology; following with ENT for sinus headache x months    HPI Theresa Payne presents to establish care.  Discussed the use of AI scribe software for clinical note transcription with the patient, who gave verbal consent to proceed.  History of Present Illness   Theresa Payne is a 53 year old female who presents with elevated lead levels and persistent sinus headaches.  She has elevated lead levels in her blood, discovered after self-initiated testing due to feeling unwell. There is no known current exposure to lead, and she has tested her water supply. She wonders if the lead may be leaching from her bones, as she lived through the leaded gasoline era. She denies symptoms such as abdominal pain, nausea, or vomiting.  She has been experiencing a persistent sinus headache to the R frontal sinus for four to five months, which worsens when bending over. Initially suspected a sinus infection and was prescribed an antibiotic, which did not alleviate the headache but improved joint pain. She reports that an ENT evaluation with a camera was performed and she was told that everything looked great in her sinuses. She has tried various antihistamines without relief. No vision changes and no recent imaging of her sinuses.  She experiences joint pain, primarily in her hips, with the right hip being more painful. She has a history of a tibial plateau fracture 30 years ago, which led to complications in her left hip. She has arthritis but no rheumatoid arthritis markers. She takes Aleve for pain, which provides minimal relief. No recent imaging of her hips.  She has a history of elevated cholesterol and was previously suspected  of having familial hypercholesterolemia, but genetic testing ruled it out. She has elevated CRP levels and is concerned about inflammation. She has not had a comprehensive autoimmune workup beyond ANA and RF tests, which were normal.  Her current medications include hormonal supplements, which she feels are ineffective as she continues to experience hot flashes. She also takes vitamin C, B1, B2, and magnesium supplements. The magnesium is taken to alleviate constipation.  Pt does complain of 30# weight gain over the past 18 months without change to diet or activity level. Previously tested thyroid  and cortisol levels were normal.   She quit smoking 20 years ago and is cautious about medication use, preferring to avoid them unless necessary.       Outpatient Encounter Medications as of 08/29/2024  Medication Sig   amoxicillin -clavulanate (AUGMENTIN ) 875-125 MG tablet Take 1 tablet by mouth 2 (two) times daily with a meal.   Ascorbic Acid (VITAMIN C) 1000 MG tablet Take 1,000 mg by mouth daily.   estradiol (ESTRACE) 2 MG tablet Take 2 mg by mouth daily.   Magnesium 200 MG TABS Take by mouth. Take ever other day   progesterone (PROMETRIUM) 200 MG capsule    Riboflavin (VITAMIN B2 PO) Take 36.5 mg by mouth.   thiamine (VITAMIN B-1) 50 MG tablet Take 50 mg by mouth daily.   No facility-administered encounter medications on file as of 08/29/2024.    Past Medical History:  Diagnosis Date   Allergy    seasonal   Arthritis    hip  DM (diabetes mellitus) (HCC)    pt.denies updated 12/21/22   Headache(784.0)    History of bronchitis    many of yrs ago   Hypercholesterolemia    Joint pain    Joint swelling    Osteopenia     Past Surgical History:  Procedure Laterality Date   ABLATION     uterine   HIP ARTHROSCOPY Left 01/24/2013   Procedure: ARTHROSCOPY HIP with labral tear debridment and chondroplasty;  Surgeon: Toribio JULIANNA Chancy, MD;  Location: [facility] OR;  Service: Orthopedics;   Laterality: Left;    Family History  Problem Relation Age of Onset   Hypercholesterolemia Mother    Dementia Mother    Osteoporosis Mother    Colitis Mother    Hypertension Mother    Hyperlipidemia Mother    Arthritis Mother    Varicose Veins Mother    Hypercholesterolemia Father    Cancer Father    Hypertension Father    Stroke Father    Hyperlipidemia Father    Hypercholesterolemia Brother    Hypertension Brother    Hyperlipidemia Brother    Asthma Brother    Osteoporosis Maternal Grandmother    Dementia Maternal Grandmother    Diabetes Maternal Grandfather    Heart disease Maternal Grandfather    Dementia Paternal Grandfather    Asthma Son    Colon cancer Neg Hx    Colon polyps Neg Hx    Crohn's disease Neg Hx    Esophageal cancer Neg Hx    Rectal cancer Neg Hx    Stomach cancer Neg Hx     Social History   Socioeconomic History   Marital status: Divorced    Spouse name: Not on file   Number of children: Not on file   Years of education: Not on file   Highest education level: Bachelor's degree (e.g., BA, AB, BS)  Occupational History   Not on file  Tobacco Use   Smoking status: Former    Current packs/day: 0.00    Types: Cigarettes    Quit date: 09/27/1994    Years since quitting: 29.9   Smokeless tobacco: Never   Tobacco comments:    quit smoking 102yrs ago  Vaping Use   Vaping status: Never Used  Substance and Sexual Activity   Alcohol use: Yes    Alcohol/week: 1.0 standard drink of alcohol    Types: 1 Glasses of wine per week    Comment: occasionally   Drug use: Never   Sexual activity: Yes    Birth control/protection: None  Other Topics Concern   Not on file  Social History Narrative   Not on file   Social Drivers of Health   Financial Resource Strain: Low Risk  (08/27/2024)   Overall Financial Resource Strain (CARDIA)    Difficulty of Paying Living Expenses: Not very hard  Food Insecurity: No Food Insecurity (08/27/2024)   Hunger Vital  Sign    Worried About Running Out of Food in the Last Year: Never true    Ran Out of Food in the Last Year: Never true  Transportation Needs: No Transportation Needs (08/27/2024)   PRAPARE - Administrator, Civil Service (Medical): No    Lack of Transportation (Non-Medical): No  Physical Activity: Sufficiently Active (08/27/2024)   Exercise Vital Sign    Days of Exercise per Week: 6 days    Minutes of Exercise per Session: 70 min  Stress: No Stress Concern Present (08/27/2024)   [employer] of Occupational Health -  Occupational Stress Questionnaire    Feeling of Stress: Not at all  Social Connections: Socially Isolated (08/27/2024)   Social Connection and Isolation Panel    Frequency of Communication with Friends and Family: More than three times a week    Frequency of Social Gatherings with Friends and Family: More than three times a week    Attends Religious Services: Never    Database Administrator or Organizations: No    Attends Engineer, Structural: Not on file    Marital Status: Divorced  Intimate Partner Violence: Unknown (12/31/2021)   Received from [facility]   HITS    Physically Hurt: Not on file    Insult or Talk Down To: Not on file    Threaten Physical Harm: Not on file    Scream or Curse: Not on file    ROS: as noted in HPI  Objective:  BP 96/67   Pulse 68   Ht 5' 5 (1.651 m)   Wt 167 lb (75.8 kg)   SpO2 99%   BMI 27.79 kg/m   Physical Exam Vitals and nursing note reviewed. Exam conducted with a chaperone present.  Constitutional:      General: She is not in acute distress.    Appearance: Normal appearance. She is not ill-appearing, toxic-appearing or diaphoretic.  HENT:     Head: Normocephalic and atraumatic.      Right Ear: Tympanic membrane, ear canal and external ear normal. There is no impacted cerumen.     Left Ear: Tympanic membrane, ear canal and external ear normal. There is no impacted cerumen.     Nose: Nose normal.      Mouth/Throat:     Mouth: Mucous membranes are moist.     Pharynx: Oropharynx is clear. No oropharyngeal exudate or posterior oropharyngeal erythema.  Eyes:     General: No scleral icterus.       Right eye: No discharge.        Left eye: No discharge.     Extraocular Movements: Extraocular movements intact.     Pupils: Pupils are equal, round, and reactive to light.  Neck:     Thyroid : No thyroid  mass, thyromegaly or thyroid  tenderness.   Cardiovascular:     Rate and Rhythm: Normal rate and regular rhythm.     Pulses: Normal pulses.     Heart sounds: No murmur heard. Pulmonary:     Effort: Pulmonary effort is normal. No respiratory distress.     Breath sounds: Normal breath sounds. No stridor. No wheezing or rhonchi.  Musculoskeletal:        General: No swelling, deformity or signs of injury. Normal range of motion.     Cervical back: Normal range of motion and neck supple. No rigidity or tenderness.     Right lower leg: No edema.     Left lower leg: No edema.  Lymphadenopathy:     Cervical: No cervical adenopathy.  Skin:    General: Skin is warm and dry.     Coloration: Skin is not jaundiced.     Findings: No bruising, erythema or rash.  Neurological:     General: No focal deficit present.     Mental Status: She is alert and oriented to person, place, and time.     Sensory: No sensory deficit.     Motor: No weakness.  Psychiatric:        Mood and Affect: Mood normal.        Behavior: Behavior normal.  Last CBC Lab Results  Component Value Date   WBC 7.1 01/15/2013   HGB 15.0 01/15/2013   HCT 42.0 03/28/2023   MCV 89.4 01/15/2013   MCH 31.7 01/15/2013   RDW 12.4 01/15/2013   PLT 161 01/15/2013   Last metabolic panel Lab Results  Component Value Date   GLUCOSE 85 01/24/2013   NA 140 01/24/2013   K 3.9 01/24/2013   CL 106 01/24/2013   CO2 28 01/24/2013   BUN 15 01/24/2013   CREATININE 0.80 01/24/2013   CALCIUM 8.8 01/24/2013   PROT 6.4 01/24/2013    ALBUMIN 3.4 (L) 01/24/2013   BILITOT 0.6 01/24/2013   ALKPHOS 55 01/24/2013   AST 20 01/24/2013   ALT 11 01/24/2013   Last lipids No results found for: CHOL, HDL, LDLCALC, LDLDIRECT, TRIG, CHOLHDL Last hemoglobin A1c No results found for: HGBA1C Last thyroid  functions Lab Results  Component Value Date   TSH 2.980 03/28/2023   T3TOTAL 51 (L) 03/28/2023   FREET4 0.93 03/28/2023   THYROIDAB 12 03/28/2023   Last vitamin D  Lab Results  Component Value Date   VD25OH 64.1 03/28/2023   Last vitamin B12 and Folate Lab Results  Component Value Date   VITAMINB12 1,246 (H) 03/28/2023      Assessment & Plan:  Frontal sinus pain -     Sedimentation rate -     C-reactive protein -     Amoxicillin -Pot Clavulanate; Take 1 tablet by mouth 2 (two) times daily with a meal.  Dispense: 20 tablet; Refill: 0  Bilateral hip pain -     Sedimentation rate -     C-reactive protein -     ANA,IFA RA Diag Pnl w/rflx Tit/Patn -     Lupus Diagnostic Profile -     DG HIP UNILAT W OR W/O PELVIS 2-3 VIEWS RIGHT; Future  Toxic effect of lead, undetermined intent, initial encounter -     Lead, blood (adult age 17 yrs or greater)  Neck mass -     US  SOFT TISSUE HEAD & NECK (NON-THYROID ); Future -     CBC with Differential/Platelet -     CMP14+EGFR  Weight gain -     TSH + free T4 -     CMP14+EGFR  Assessment and Plan    Chronic frontal sinus pain Persisting pain with no sinus infection on ENT evaluation. Differential includes chronic sinusitis. TMJ ruled out. - will repeat course of augmentin  as some improvement noted - will consider CT of sinuses pending response to the above - Considered low-dose prednisone trial for 5 days, pt declines  Bilateral hip pain Right hip pain worse. Differential includes arthritis and inflammatory conditions. - Ordered x-ray of right hip. - Ordered inflammatory and autoimmune workup including CRP, ESR, and repeat ANA with reflex to  titer.  Lead toxicity Elevated lead level at 4.2 with no current known exposure. Question if this is contributing to the headaches and joint pain. - Repeated lead level test.  Neck mass Possibly an enlarged lymph node, uncharacteristic shape however. - Ordered ultrasound of neck. - Repeated CBC.  Abnormal weight gain 30-pound gain over 18 months. Thyroid  function previously normal. - Repeated thyroid  function tests including TSH and free T4. - Ordered inflammatory workup.        Return in about 2 weeks (around 09/12/2024).   Benton LITTIE Gave, PA

## 2024-08-30 ENCOUNTER — Other Ambulatory Visit

## 2024-08-30 ENCOUNTER — Ambulatory Visit: Payer: Self-pay | Admitting: Urgent Care

## 2024-08-30 DIAGNOSIS — E041 Nontoxic single thyroid nodule: Secondary | ICD-10-CM

## 2024-08-30 DIAGNOSIS — R221 Localized swelling, mass and lump, neck: Secondary | ICD-10-CM

## 2024-09-13 LAB — CBC WITH DIFFERENTIAL/PLATELET
Basophils Absolute: 0.1 x10E3/uL (ref 0.0–0.2)
Basos: 1 %
EOS (ABSOLUTE): 0.1 x10E3/uL (ref 0.0–0.4)
Eos: 1 %
Hematocrit: 47 % — ABNORMAL HIGH (ref 34.0–46.6)
Hemoglobin: 15.4 g/dL (ref 11.1–15.9)
Immature Grans (Abs): 0 x10E3/uL (ref 0.0–0.1)
Immature Granulocytes: 0 %
Lymphocytes Absolute: 1.2 x10E3/uL (ref 0.7–3.1)
Lymphs: 24 %
MCH: 31.9 pg (ref 26.6–33.0)
MCHC: 32.8 g/dL (ref 31.5–35.7)
MCV: 97 fL (ref 79–97)
Monocytes Absolute: 0.4 x10E3/uL (ref 0.1–0.9)
Monocytes: 8 %
Neutrophils Absolute: 3.3 x10E3/uL (ref 1.4–7.0)
Neutrophils: 66 %
Platelets: 192 x10E3/uL (ref 150–450)
RBC: 4.83 x10E6/uL (ref 3.77–5.28)
RDW: 11.9 % (ref 11.7–15.4)
WBC: 5 x10E3/uL (ref 3.4–10.8)

## 2024-09-13 LAB — TSH+FREE T4
Free T4: 1.01 ng/dL (ref 0.82–1.77)
TSH: 2.08 u[IU]/mL (ref 0.450–4.500)

## 2024-09-13 LAB — LUPUS DIAGNOSTIC PROFILE
Anti-Chromatin Ab, IgG (RDL): <20 U (ref <20)
Anti-La (SS-B) Ab (RDL): <20 U (ref <20)
Anti-Nuclear Ab by IFA (RDL): POSITIVE — AB
Anti-Ro (SS-A) Ab (RDL): <20 U (ref <20)
Anti-Sm Ab (RDL): <20 U (ref <20)
Anti-U1 RNP Ab (RDL): <20 U (ref <20)
Anti-dsDNA Ab by Farr(RDL): <8 [IU]/mL (ref <8.0)
C3 Complement (RDL): 168 mg/dL (ref 90–180)
C4 Complement (RDL): 44 mg/dL — AB (ref 10–40)

## 2024-09-13 LAB — LEAD, BLOOD (ADULT >= 16 YRS): Lead-Whole Blood: 2.8 ug/dL (ref 0.0–3.4)

## 2024-09-13 LAB — CMP14+EGFR
ALT: 17 IU/L (ref 0–32)
AST: 20 IU/L (ref 0–40)
Albumin: 4.1 g/dL (ref 3.8–4.9)
Alkaline Phosphatase: 46 IU/L — ABNORMAL LOW (ref 49–135)
BUN/Creatinine Ratio: 24 — ABNORMAL HIGH (ref 9–23)
BUN: 20 mg/dL (ref 6–24)
Bilirubin Total: 0.5 mg/dL (ref 0.0–1.2)
CO2: 21 mmol/L (ref 20–29)
Calcium: 8.8 mg/dL (ref 8.7–10.2)
Chloride: 104 mmol/L (ref 96–106)
Creatinine, Ser: 0.82 mg/dL (ref 0.57–1.00)
Globulin, Total: 2.3 g/dL (ref 1.5–4.5)
Glucose: 76 mg/dL (ref 70–99)
Potassium: 4.7 mmol/L (ref 3.5–5.2)
Sodium: 139 mmol/L (ref 134–144)
Total Protein: 6.4 g/dL (ref 6.0–8.5)
eGFR: 85 mL/min/1.73 (ref >59)

## 2024-09-13 LAB — ANA TITER AND PATTERN: Speckled Pattern: 1:40 {titer} — ABNORMAL HIGH

## 2024-09-13 LAB — SEDIMENTATION RATE: Sed Rate: 24 mm/h (ref 0–40)

## 2024-09-13 LAB — ANA,IFA RA DIAG PNL W/RFLX TIT/PATN
Cyclic Citrullin Peptide Ab: 9 U (ref 0–19)
Rheumatoid fact SerPl-aCnc: 12.2 [IU]/mL (ref <14.0)

## 2024-09-13 LAB — C-REACTIVE PROTEIN: CRP: 5 mg/L (ref 0–10)

## 2024-09-21 ENCOUNTER — Other Ambulatory Visit (HOSPITAL_COMMUNITY): Payer: Self-pay | Admitting: Radiology

## 2024-09-21 ENCOUNTER — Ambulatory Visit: Admitting: Urgent Care

## 2024-09-21 VITALS — BP 93/65 | HR 60 | Ht 65.0 in | Wt 166.0 lb

## 2024-09-21 DIAGNOSIS — R5383 Other fatigue: Secondary | ICD-10-CM | POA: Diagnosis not present

## 2024-09-21 DIAGNOSIS — R519 Headache, unspecified: Secondary | ICD-10-CM | POA: Diagnosis not present

## 2024-09-21 DIAGNOSIS — M25552 Pain in left hip: Secondary | ICD-10-CM

## 2024-09-21 DIAGNOSIS — M25551 Pain in right hip: Secondary | ICD-10-CM

## 2024-09-21 DIAGNOSIS — R221 Localized swelling, mass and lump, neck: Secondary | ICD-10-CM

## 2024-09-21 DIAGNOSIS — E041 Nontoxic single thyroid nodule: Secondary | ICD-10-CM | POA: Diagnosis not present

## 2024-09-21 NOTE — Progress Notes (Signed)
 Per Dr. Johann:  Johann Sieving, MD  Lin Delon BURNETT Lowella, [place] L, [place] The thyroid  nodule does not meet current TIRADS criteria for biopsy, unless history of neck irradiation,  or other high risk criteria. In the absence of additional  clinical info, recommend 1 yr f/u US .  There is no neck mass seen on neck US  for core biopsy.  Thanks JONETTA Johann MD IR [phone]     Sent message to Benton Lowella, [place] ordering provider with this information. JM

## 2024-09-21 NOTE — Progress Notes (Signed)
 "  Established Patient Office Visit  Subjective:  Patient ID: Theresa Payne, female    DOB: 1971-09-01  Age: 53 y.o. MRN: [id number]  Chief Complaint  Patient presents with   Follow-up    HPI  Discussed the use of AI scribe software for clinical note transcription with the patient, who gave verbal consent to proceed.  History of Present Illness   Theresa Payne is a 53 year old female who presents with worsening headaches and hip pain.  She has been experiencing worsening headaches, which had previously improved. She does not specify any associated symptoms with the headaches.  Her hip pain had resolved for four to five days after completing a course of antibiotics but has since returned. The pain affects both hips, with the left side having a history of injury from a tibial plateau fracture and cartilage cleaning procedure. The right hip, which has no known injury, is more painful.  She has a history of a thyroid  nodule and is awaiting further evaluation. She has been supplementing with iodine due to a previous deficiency, which she correlates with a reduction in hair loss.  She mentions a past bullseye rash in 2023, for which she received antibiotics, and has ongoing exposure to tick habitats, raising concerns about Lyme disease. She also reports a positive ANA test with a titer of 1:40.  She experiences sinus pain that had improved but is now returning. A CT scan of the sinuses showed no infection.  Her weight has been stable over the past month or two, though she is concerned about being thirty pounds overweight, which she feels exacerbates her arthritis.  Current medications include estradiol, progesterone, iodine (sea-Kelp), and biotin. She has completed a course of Augmentin .  She works from home and spends time in the woods daily with her puppy. No lymph node swelling in areas other than the neck and back of the knees, no cough, no shortness of breath, and she is normally  constipated. She experiences low energy levels, requiring daily naps, and sleeps 6-7 hours per night, often interrupted. She has not had recent strep infections.       Patient Active Problem List   Diagnosis Date Noted   Bilateral hip pain 09/22/2024   Neck mass 09/22/2024   Thyroid  nodule incidentally noted on imaging study 09/22/2024   Past Medical History:  Diagnosis Date   Allergy    seasonal   Arthritis    hip   DM (diabetes mellitus) (HCC)    pt.denies updated 12/21/22   Headache(784.0)    History of bronchitis    many of yrs ago   Hypercholesterolemia    Joint pain    Joint swelling    Osteopenia    Past Surgical History:  Procedure Laterality Date   ABLATION     uterine   HIP ARTHROSCOPY Left 01/24/2013   Procedure: ARTHROSCOPY HIP with labral tear debridment and chondroplasty;  Surgeon: Toribio JULIANNA Chancy, MD;  Location: [facility];  Service: Orthopedics;  Laterality: Left;   Social History[1]    ROS: as noted in HPI  Objective:     BP 93/65   Pulse 60   Ht 5' 5 (1.651 m)   Wt 166 lb (75.3 kg)   SpO2 99%   BMI 27.62 kg/m  BP Readings from Last 3 Encounters:  09/21/24 93/65  08/29/24 96/67  06/26/24 115/81   Wt Readings from Last 3 Encounters:  09/21/24 166 lb (75.3 kg)  08/29/24 167 lb (75.8 kg)  03/14/23 135 lb (61.2 kg)      Physical Exam Vitals and nursing note reviewed. Exam conducted with a chaperone present.  Constitutional:      General: She is not in acute distress.    Appearance: Normal appearance. She is not ill-appearing, toxic-appearing or diaphoretic.  HENT:     Head: Normocephalic and atraumatic.     Right Ear: Tympanic membrane, ear canal and external ear normal. There is no impacted cerumen.     Left Ear: Tympanic membrane, ear canal and external ear normal. There is no impacted cerumen.     Nose: Nose normal.     Right Turbinates: Not enlarged or swollen.     Left Turbinates: Not enlarged or swollen.     Right Sinus: No  maxillary sinus tenderness or frontal sinus tenderness.     Left Sinus: No maxillary sinus tenderness or frontal sinus tenderness.     Mouth/Throat:     Lips: Pink.     Mouth: Mucous membranes are moist.     Pharynx: Oropharynx is clear. No oropharyngeal exudate or posterior oropharyngeal erythema.  Eyes:     General: No scleral icterus.       Right eye: No discharge.        Left eye: No discharge.     Extraocular Movements: Extraocular movements intact.     Pupils: Pupils are equal, round, and reactive to light.  Neck:     Thyroid : No thyroid  mass, thyromegaly or thyroid  tenderness.   Cardiovascular:     Rate and Rhythm: Normal rate and regular rhythm.     Pulses: Normal pulses.     Heart sounds: No murmur heard. Pulmonary:     Effort: Pulmonary effort is normal. No respiratory distress.     Breath sounds: Normal breath sounds. No stridor. No wheezing or rhonchi.  Musculoskeletal:        General: No swelling, deformity or signs of injury.     Cervical back: Neck supple. No rigidity or tenderness.     Right lower leg: No edema.     Left lower leg: No edema.  Skin:    General: Skin is warm and dry.     Coloration: Skin is not jaundiced.     Findings: No bruising, erythema or rash.  Neurological:     General: No focal deficit present.     Mental Status: She is alert and oriented to person, place, and time.     Sensory: No sensory deficit.     Motor: No weakness.  Psychiatric:        Mood and Affect: Mood normal.        Behavior: Behavior normal.      No results found for any visits on 09/21/24.  Last CBC Lab Results  Component Value Date   WBC 5.0 08/29/2024   HGB 15.4 08/29/2024   HCT 47.0 (H) 08/29/2024   MCV 97 08/29/2024   MCH 31.9 08/29/2024   RDW 11.9 08/29/2024   PLT 192 08/29/2024   Last metabolic panel Lab Results  Component Value Date   GLUCOSE 76 08/29/2024   NA 139 08/29/2024   K 4.7 08/29/2024   CL 104 08/29/2024   CO2 21 08/29/2024   BUN 20  08/29/2024   CREATININE 0.82 08/29/2024   EGFR 85 08/29/2024   CALCIUM 8.8 08/29/2024   PROT 6.4 08/29/2024   ALBUMIN 4.1 08/29/2024   LABGLOB 2.3 08/29/2024   BILITOT 0.5 08/29/2024   ALKPHOS 46 (L) 08/29/2024  AST 20 08/29/2024   ALT 17 08/29/2024   Last lipids No results found for: CHOL, HDL, LDLCALC, LDLDIRECT, TRIG, CHOLHDL Last hemoglobin A1c No results found for: HGBA1C Last thyroid  functions Lab Results  Component Value Date   TSH 2.080 08/29/2024   T3TOTAL 51 (L) 03/28/2023   FREET4 1.01 08/29/2024   THYROIDAB 12 03/28/2023   Last vitamin D  Lab Results  Component Value Date   VD25OH 64.1 03/28/2023   Last vitamin B12 and Folate Lab Results  Component Value Date   VITAMINB12 1,246 (H) 03/28/2023      The ASCVD Risk score (Arnett DK, et al., 2019) failed to calculate for the following reasons:   According to ACC/AHA guidelines, patients with an LDL-C level greater than 190 mg/dL (5.08 mmol/L) should be considered for high-intensity statin therapy. This patient's most recent LDL-C level is 208 mg/dL.  Assessment & Plan:  Bilateral hip pain -     Antistreptolysin O titer -     HLA-B27 antigen -     Uric acid -     Iodine, Serum/Plasma -     Lyme Disease Serology w/Reflex  Neck mass -     CT SOFT TISSUE NECK W CONTRAST; Future  Thyroid  nodule incidentally noted on imaging study  Frequent headaches  Other fatigue  Assessment and Plan    Bilateral hip pain, evaluation for inflammatory arthritis Pain improved post-antibiotics, suggesting inflammatory or reactive arthritis. Positive ANA titer 1:40, minimally elevated. Differential includes Lyme disease and post-streptococcal reactive arthritis. - Ordered Lyme disease serology. - Ordered ASO titer. - Will consider referral to rheumatology for further evaluation pending labs today.  Neck mass, evaluation for malignancy Neck mass with recurrence. Previous ultrasound negative, CT scan needed  for further evaluation. Mass still palpable on L side of neck under ramus of jaw and anterior to SCM. - Ordered CT scan of the neck with contrast.  Thyroid  nodule, evaluation for malignancy Incidental thyroid  nodule detected on ultrasound. Repeat US  in 1 year recommended, but given constellation of symptoms, pt would like to proceed with fine needle aspiration. - Contact interventional radiology to schedule fine needle aspiration.  General health maintenance Weight stable but pt c/o 30 pounds weight gain, affecting arthritis. Mammogram normal, dense breast tissue noted. Vaccines overdue. - Encouraged weight management. - Will update shingles and tetanus vaccines - this is to be completed at pharmacy per insurance requirements - Continue routine mammogram and Pap smear screenings.         No follow-ups on file.   Benton LITTIE Gave, PA     [1]  Social History Tobacco Use   Smoking status: Former    Current packs/day: 0.00    Types: Cigarettes    Quit date: 09/27/1994    Years since quitting: 30.0   Smokeless tobacco: Never   Tobacco comments:    quit smoking 12yrs ago  Vaping Use   Vaping status: Never Used  Substance Use Topics   Alcohol use: Yes    Alcohol/week: 1.0 standard drink of alcohol    Types: 1 Glasses of wine per week    Comment: occasionally   Drug use: Never   "

## 2024-09-21 NOTE — Patient Instructions (Addendum)
 [facility] [address] [place] [address] [phone]  Call the above number to schedule thyroid  biopsy.  Please go downstairs to [facility] to obtain labs today.  Please complete the CT scan of your neck.   Follow up once the above has been completed

## 2024-09-21 NOTE — Progress Notes (Signed)
 Received requests for CT neck mass bx and US  FNA thyroid  bx from [place], [place].  Both were placed in review with IR doctors. JM

## 2024-09-22 ENCOUNTER — Encounter: Payer: Self-pay | Admitting: Urgent Care

## 2024-09-22 DIAGNOSIS — E041 Nontoxic single thyroid nodule: Secondary | ICD-10-CM | POA: Insufficient documentation

## 2024-09-22 DIAGNOSIS — M25551 Pain in right hip: Secondary | ICD-10-CM | POA: Insufficient documentation

## 2024-09-22 DIAGNOSIS — R221 Localized swelling, mass and lump, neck: Secondary | ICD-10-CM | POA: Insufficient documentation

## 2024-09-23 ENCOUNTER — Ambulatory Visit: Payer: Self-pay | Admitting: Urgent Care

## 2024-09-25 ENCOUNTER — Telehealth: Payer: Self-pay | Admitting: Urgent Care

## 2024-09-25 LAB — ANTISTREPTOLYSIN O TITER: ASO: 39 [IU]/mL (ref 0.0–200.0)

## 2024-09-25 LAB — LYME DISEASE SEROLOGY W/REFLEX

## 2024-09-25 LAB — URIC ACID: Uric Acid: 2.6 mg/dL — AB (ref 3.0–7.2)

## 2024-09-25 LAB — IODINE, SERUM/PLASMA: Iodine: 50.1 ug/L (ref 31.1–64.6)

## 2024-09-25 LAB — HLA-B27 ANTIGEN

## 2024-09-25 NOTE — Telephone Encounter (Signed)
 Copied from CRM #[id number]. Topic: Clinical - Request for Lab/Test Order >> Sep 25, 2024  3:45 PM Antony RAMAN wrote: Reason for CRM: patient called back to let Dr Lowella know that they were not able to do the neck biopsy. Need a new order [id number]

## 2024-09-26 NOTE — Progress Notes (Unsigned)
 [name], Theresa Payne,  This is that lady from yesterday. See the note below. Now Dr. Johann says we can do an US  FNA of the thyroid  lesion. Please schedule ASAP. Thanks Wyvonna       Previous Messages    ----- Message ----- From: Johann Sieving, MD Sent: 09/26/2024   7:23 AM EST To: Theresa Payne; Benton LITTIE Gave, PA Subject: RE: US  FNA bx and CT neck mass bx requested    We can proceed with US  FNA thyroid  lesion  DDH ----- Message ----- From: Gave Benton LITTIE, PA Sent: 09/21/2024   4:53 PM EST To: Sieving Johann, MD Subject: RE: US  FNA bx and CT neck mass bx requested    Yes, I read that in the report. I'm not sure how there is no mass as I can palpate it easily on exam which is why I am proceeding with CT. Regarding the aspiration, patient has a lot of non-specific symptoms and had a prior abnormal iodine test. She has not had radiation to the neck but is severely concerned for cancer. Is there any way to proceed with FNA? She is very concerned and does not want to wait a year... thanks so much. ----- Message ----- From: Johann Sieving, MD Sent: 09/21/2024  10:52 AM EST To: Theresa Payne; Benton LITTIE Gave, PA Subject: RE: US  FNA bx and CT neck mass bx requested    The thyroid  nodule does not meet current TIRADS criteria for biopsy, unless history of neck irradiation,  or other high risk criteria. In the absence of additional  clinical info, recommend 1 yr f/u US .  There is no neck mass seen on neck US  for core biopsy.  Thanks JONETTA Johann MD IR [phone] ----- Message ----- From: [name] Sent: 09/21/2024  10:19 AM EST To: Ir Procedure Requests Subject: US  FNA bx and CT neck mass bx requested        Provider: Benton Gave, PA  Provider has requested both an US  FNA thyroid  and a CT neck mass bx. Please review and advise.  Thanks Jen

## 2024-09-28 NOTE — Telephone Encounter (Signed)
 Spoke with patient she states the neck CT and the biopsy are for two separate things.  She is fine to await Whitney's return  on Monday 10/01/2024 to address this as she is more familiar with this.

## 2024-09-28 NOTE — Telephone Encounter (Signed)
 SABRA

## 2024-09-28 NOTE — Telephone Encounter (Signed)
 It was for a CT scan

## 2024-09-28 NOTE — Telephone Encounter (Signed)
 There is an order that was put in on 09-21-24 by whitney

## 2024-09-28 NOTE — Telephone Encounter (Signed)
 Can we get more clarification on this.  I do not actually see an order so I do not know what it was for exactly and the location where it was ordered so if we could please clarify I do not mind putting a new one in.

## 2024-09-28 NOTE — Telephone Encounter (Signed)
 She will need an order I spoke with Imaging and they said it needs to be done at [facility] not quite sure which test

## 2024-09-28 NOTE — Progress Notes (Signed)
 Hi Theresa Payne, uric acid level is under 6 which is great.  HLA-B27 is also normal so less likely to be an indication of ankylosing spondylitis.  Lyme's disease titer is also negative.

## 2024-10-01 ENCOUNTER — Other Ambulatory Visit: Payer: Self-pay | Admitting: Urgent Care

## 2024-10-01 DIAGNOSIS — E041 Nontoxic single thyroid nodule: Secondary | ICD-10-CM

## 2024-10-01 NOTE — Telephone Encounter (Signed)
I have placed a new order

## 2024-10-01 NOTE — Telephone Encounter (Signed)
 Copied from CRM [id number]. Topic: Clinical - Request for Lab/Test Order >> Oct 01, 2024  9:24 AM Delon HERO wrote: Reason for CRM: Channing calling from [facility] is calling to request a new order for US -biopsy thyroid . CB- [phone] ask for [facility].

## 2024-10-08 ENCOUNTER — Ambulatory Visit

## 2024-10-08 DIAGNOSIS — R221 Localized swelling, mass and lump, neck: Secondary | ICD-10-CM

## 2024-10-08 MED ORDER — IOHEXOL 300 MG/ML  SOLN
100.0000 mL | Freq: Once | INTRAMUSCULAR | Status: AC | PRN
  Administered 2024-10-08: 75 mL via INTRAVENOUS

## 2024-10-19 ENCOUNTER — Ambulatory Visit (HOSPITAL_COMMUNITY)
Admission: RE | Admit: 2024-10-19 | Discharge: 2024-10-19 | Disposition: A | Source: Ambulatory Visit | Attending: Urgent Care | Admitting: Urgent Care

## 2024-10-19 DIAGNOSIS — E041 Nontoxic single thyroid nodule: Secondary | ICD-10-CM | POA: Diagnosis present

## 2024-10-19 MED ORDER — LIDOCAINE HCL (PF) 1 % IJ SOLN
10.0000 mL | Freq: Once | INTRAMUSCULAR | Status: AC
  Administered 2024-10-19: 10 mL via INTRADERMAL

## 2024-10-19 NOTE — Procedures (Signed)
 PROCEDURE SUMMARY:  Using direct ultrasound guidance, 5 passes were made using 25 g needles into the nodule within the right lobe of the thyroid .   Ultrasound was used to confirm needle placements on all occasions.   EBL = trace  Specimens were sent to Pathology for analysis.  See procedure note under Imaging tab in Epic for full procedure details.    Electronically Signed: Carlin DELENA Griffon, PA-C 10/19/2024, 2:14 PM

## 2024-10-22 ENCOUNTER — Ambulatory Visit: Payer: Self-pay | Admitting: Family Medicine

## 2024-10-22 LAB — CYTOLOGY - NON PAP

## 2024-10-22 NOTE — Progress Notes (Signed)
 Biopsy came back negative for any abnormal tissue in the thyroid  nodule.

## 2024-10-30 ENCOUNTER — Ambulatory Visit: Admitting: Urgent Care

## 2024-10-31 ENCOUNTER — Ambulatory Visit: Admitting: Urgent Care

## 2024-11-14 ENCOUNTER — Ambulatory Visit: Admitting: Urgent Care
